# Patient Record
Sex: Female | Born: 1955 | Hispanic: No | Marital: Married | Smoking: Former smoker
Health system: Southern US, Community
[De-identification: ages and names within clinical notes are randomized; demographics above are authoritative.]

## PROBLEM LIST (undated history)

## (undated) DIAGNOSIS — J45909 Unspecified asthma, uncomplicated: Secondary | ICD-10-CM

## (undated) DIAGNOSIS — Z8489 Family history of other specified conditions: Secondary | ICD-10-CM

## (undated) DIAGNOSIS — T8859XA Other complications of anesthesia, initial encounter: Secondary | ICD-10-CM

## (undated) DIAGNOSIS — T7840XA Allergy, unspecified, initial encounter: Secondary | ICD-10-CM

## (undated) HISTORY — DX: Allergy, unspecified, initial encounter: T78.40XA

## (undated) HISTORY — PX: OTHER SURGICAL HISTORY: SHX169

## (undated) HISTORY — DX: Unspecified asthma, uncomplicated: J45.909

---

## 2010-03-19 HISTORY — PX: COLONOSCOPY: SHX174

## 2015-03-03 ENCOUNTER — Encounter: Payer: Self-pay | Admitting: Family Medicine

## 2015-03-03 ENCOUNTER — Ambulatory Visit (INDEPENDENT_AMBULATORY_CARE_PROVIDER_SITE_OTHER): Payer: BLUE CROSS/BLUE SHIELD | Admitting: Family Medicine

## 2015-03-03 VITALS — BP 110/70 | HR 74 | Ht 62.0 in | Wt 164.0 lb

## 2015-03-03 DIAGNOSIS — Z7189 Other specified counseling: Secondary | ICD-10-CM

## 2015-03-03 DIAGNOSIS — Z7689 Persons encountering health services in other specified circumstances: Secondary | ICD-10-CM | POA: Insufficient documentation

## 2015-03-03 NOTE — Progress Notes (Signed)
Name: Monica Mckenzie   MRN: [id number]    DOB: 1955/11/24   Date:03/03/2015       Progress Note  Subjective  Chief Complaint  Chief Complaint  Patient presents with  . Establish Care    Dr retired    HPI Comments: Patient to establish care for maintenance and primary care.   No problem-specific assessment & plan notes found for this encounter.   Past Medical History  Diagnosis Date  . Allergy   . Asthma     Past Surgical History  Procedure Laterality Date  . Colonoscopy  2012    cleared for 10 yrs- Dr Bluford Kaufmann    Family History  Problem Relation Age of Onset  . Cancer Mother   . Diabetes Father   . Hypertension Father     Social History   Social History  . Marital Status: Married    Spouse Name: N/A  . Number of Children: N/A  . Years of Education: N/A   Occupational History  . Not on file.   Social History Main Topics  . Smoking status: Former Games developer  . Smokeless tobacco: Not on file  . Alcohol Use: 0.0 oz/week    0 Standard drinks or equivalent per week  . Drug Use: No  . Sexual Activity: Yes   Other Topics Concern  . Not on file   Social History Narrative  . No narrative on file    No Known Allergies   Review of Systems  Constitutional: Negative for fever, chills, weight loss and malaise/fatigue.  HENT: Negative for ear discharge, ear pain and sore throat.   Eyes: Negative for blurred vision.  Respiratory: Negative for cough, sputum production, shortness of breath and wheezing.   Cardiovascular: Negative for chest pain, palpitations and leg swelling.  Gastrointestinal: Negative for heartburn, nausea, abdominal pain, diarrhea, constipation, blood in stool and melena.  Genitourinary: Negative for dysuria, urgency, frequency and hematuria.  Musculoskeletal: Negative for myalgias, back pain, joint pain and neck pain.  Skin: Negative for rash.  Neurological: Negative for dizziness, tingling, sensory change, focal weakness and headaches.   Endo/Heme/Allergies: Negative for environmental allergies and polydipsia. Does not bruise/bleed easily.  Psychiatric/Behavioral: Negative for depression and suicidal ideas. The patient is not nervous/anxious and does not have insomnia.      Objective  Filed Vitals:   03/03/15 1426  BP: 110/70  Pulse: 74  Height:  (1.575 m)  Weight: 164 lb (74.39 kg)    Physical Exam  Constitutional: She is well-developed, well-nourished, and in no distress. No distress.  HENT:  Head: Normocephalic and atraumatic.  Right Ear: External ear normal.  Left Ear: External ear normal.  Nose: Nose normal.  Mouth/Throat: Oropharynx is clear and moist.  Eyes: Conjunctivae and EOM are normal. Pupils are equal, round, and reactive to light. Right eye exhibits no discharge. Left eye exhibits no discharge.  Neck: Normal range of motion. Neck supple. No JVD present. No thyromegaly present.  Cardiovascular: Normal rate, regular rhythm, normal heart sounds and intact distal pulses.  Exam reveals no gallop and no friction rub.   No murmur heard. Pulmonary/Chest: Effort normal and breath sounds normal.  Abdominal: Soft. Bowel sounds are normal. She exhibits no mass. There is no tenderness. There is no guarding.  Musculoskeletal: Normal range of motion. She exhibits no edema.  Lymphadenopathy:    She has no cervical adenopathy.  Neurological: She is alert.  Skin: Skin is warm and dry. She is not diaphoretic.  Psychiatric: Mood and affect  normal.  Nursing note and vitals reviewed.     Assessment & Plan  Problem List Items Addressed This Visit      Other   Encounter to establish care with new doctor - Primary        Dr. Elizabeth Sauereanna [facility]yn [facility]  03/03/2015

## 2015-03-03 NOTE — Patient Instructions (Signed)
Immunization Schedule, Adult  Influenza vaccine.  All adults should be immunized every year.  All adults, including pregnant women and people with hives-only allergy to eggs can receive the inactivated influenza (IIV) vaccine.  Adults aged 60-49 years can receive the recombinant influenza (RIV) vaccine. The RIV vaccine does not contain any egg protein.  Adults aged 76 years or older can receive the standard-dose IIV or the high-dose IIV.  Tetanus, diphtheria, and acellular pertussis (Td, Tdap) vaccine.  Pregnant women should receive 1 dose of Tdap vaccine during each pregnancy. The dose should be obtained regardless of the length of time since the last dose. Immunization is preferred during the 27th to 36th week of gestation.  An adult who has not previously received Tdap or who does not know his or her vaccine status should receive 1 dose of Tdap. This initial dose should be followed by tetanus and diphtheria toxoids (Td) booster doses every 10 years.  Adults with an unknown or incomplete history of completing a 3-dose immunization series with Td-containing vaccines should begin or complete a primary immunization series including a Tdap dose.  Adults should receive a Td booster every 10 years.  Varicella vaccine.  An adult without evidence of immunity to varicella should receive 2 doses or a second dose if he or she has previously received 1 dose.  Pregnant females who do not have evidence of immunity should receive the first dose after pregnancy. This first dose should be obtained before leaving the health care facility. The second dose should be obtained 4-8 weeks after the first dose.  Human papillomavirus (HPV) vaccine.  Females aged 13-26 years who have not received the vaccine previously should obtain the 3-dose series.  The vaccine is not recommended for use in pregnant females. However, pregnancy testing is not needed before receiving a dose. If a female is found to be  pregnant after receiving a dose, no treatment is needed. In that case, the remaining doses should be delayed until after the pregnancy.  Males aged 37-21 years who have not received the vaccine previously should receive the 3-dose series. Males aged 22-26 years may be immunized.  Immunization is recommended through the age of 93 years for any female who has sex with males and did not get any or all doses earlier.  Immunization is recommended for any person with an immunocompromised condition through the age of 71 years if he or she did not get any or all doses earlier.  During the 3-dose series, the second dose should be obtained 4-8 weeks after the first dose. The third dose should be obtained 24 weeks after the first dose and 16 weeks after the second dose.  Zoster vaccine.  One dose is recommended for adults aged 73 years or older unless certain conditions are present.  Measles, mumps, and rubella (MMR) vaccine.  Adults born before [birth year] generally are considered immune to measles and mumps.  Adults born in [birth year] or later should have 1 or more doses of MMR vaccine unless there is a contraindication to the vaccine or there is laboratory evidence of immunity to each of the three diseases.  A routine second dose of MMR vaccine should be obtained at least 28 days after the first dose for students attending postsecondary schools, health care workers, or international travelers.  People who received inactivated measles vaccine or an unknown type of measles vaccine during 1963-1967 should receive 2 doses of MMR vaccine.  People who received inactivated mumps vaccine or an unknown type  of mumps vaccine before 1979 and are at high risk for mumps infection should consider immunization with 2 doses of MMR vaccine.  For females of childbearing age, rubella immunity should be determined. If there is no evidence of immunity, females who are not pregnant should be vaccinated. If there is no evidence of  immunity, females who are pregnant should delay immunization until after pregnancy.  Unvaccinated health care workers born before 1957 who lack laboratory evidence of measles, mumps, or rubella immunity or laboratory confirmation of disease should consider measles and mumps immunization with 2 doses of MMR vaccine or rubella immunization with 1 dose of MMR vaccine.  Pneumococcal 13-valent conjugate (PCV13) vaccine.  When indicated, a person who is uncertain of his or her immunization history and has no record of immunization should receive the PCV13 vaccine.  An adult aged 19 years or older who has certain medical conditions and has not been previously immunized should receive 1 dose of PCV13 vaccine. This PCV13 should be followed with a dose of pneumococcal polysaccharide (PPSV23) vaccine. The PPSV23 vaccine dose should be obtained at least 8 weeks after the dose of PCV13 vaccine.  An adult aged 19 years or older who has certain medical conditions and previously received 1 or more doses of PPSV23 vaccine should receive 1 dose of PCV13. The PCV13 vaccine dose should be obtained 1 or more years after the last PPSV23 vaccine dose.  Pneumococcal polysaccharide (PPSV23) vaccine.  When PCV13 is also indicated, PCV13 should be obtained first.  All adults aged 65 years and older should be immunized.  An adult younger than age 65 years who has certain medical conditions should be immunized.  Any person who resides in a nursing home or long-term care facility should be immunized.  An adult smoker should be immunized.  People with an immunocompromised condition and certain other conditions should receive both PCV13 and PPSV23 vaccines.  People with human immunodeficiency virus (HIV) infection should be immunized as soon as possible after diagnosis.  Immunization during chemotherapy or radiation therapy should be avoided.  Routine use of PPSV23 vaccine is not recommended for American Indians,  [place]atives, or people younger than 65 years unless there are medical conditions that require PPSV23 vaccine.  When indicated, people who have unknown immunization and have no record of immunization should receive PPSV23 vaccine.  One-time revaccination 5 years after the first dose of PPSV23 is recommended for people aged 19-64 years who have chronic kidney failure, nephrotic syndrome, asplenia, or immunocompromised conditions.  People who received 1-2 doses of PPSV23 before age 65 years should receive another dose of PPSV23 vaccine at age 65 years or later if at least 5 years have passed since the previous dose.  Doses of PPSV23 are not needed for people immunized with PPSV23 at or after age 65 years.  Meningococcal vaccine.  Adults with asplenia or persistent complement component deficiencies should receive 2 doses of quadrivalent meningococcal conjugate (MenACWY-D) vaccine. The doses should be obtained at least 2 months apart.  Microbiologists working with certain meningococcal bacteria, military recruits, people at risk during an outbreak, and people who travel to or live in countries with a high rate of meningitis should be immunized.  A first-year college student up through age 21 years who is living in a residence hall should receive a dose if he or she did not receive a dose on or after his or her 16th birthday.  Adults who have certain high-risk conditions should receive one or more doses   of vaccine.  Hepatitis A vaccine.  Adults who wish to be protected from this disease, have certain high-risk conditions, work with hepatitis A-infected animals, work in hepatitis A research labs, or travel to or work in countries with a high rate of hepatitis A should be immunized.  Adults who were previously unvaccinated and who anticipate close contact with an international adoptee during the first 60 days after arrival in the [place] from a country with a high rate of hepatitis A should  be immunized.  Hepatitis B vaccine.  Adults who wish to be protected from this disease, have certain high-risk conditions, may be exposed to blood or other infectious body fluids, are household contacts or sex partners of hepatitis B positive people, are clients or workers in certain care facilities, or travel to or work in countries with a high rate of hepatitis B should be immunized.  Haemophilus influenzae type b (Hib) vaccine.  A previously unvaccinated person with asplenia or sickle cell disease or having a scheduled splenectomy should receive 1 dose of Hib vaccine.  Regardless of previous immunization, a recipient of a hematopoietic stem cell transplant should receive a 3-dose series 6-12 months after his or her successful transplant.  Hib vaccine is not recommended for adults with HIV infection.   This information is not intended to replace advice given to you by your health care provider. Make sure you discuss any questions you have with your health care provider.   Document Released: 05/26/2003 Document Revised: 06/30/2012 Document Reviewed: 04/22/2012 Elsevier Interactive Patient Education 2016 [place] Maintenance, Female Adopting a healthy lifestyle and getting preventive care can go a long way to promote health and wellness. Talk with your health care provider about what schedule of regular examinations is right for you. This is a good chance for you to check in with your provider about disease prevention and staying healthy. In between checkups, there are plenty of things you can do on your own. Experts have done a lot of research about which lifestyle changes and preventive measures are most likely to keep you healthy. Ask your health care provider for more information. WEIGHT AND DIET  Eat a healthy diet  Be sure to include plenty of vegetables, fruits, low-fat dairy products, and lean protein.  Do not eat a lot of foods high in solid fats, added sugars, or  salt.  Get regular exercise. This is one of the most important things you can do for your health.  Most adults should exercise for at least 150 minutes each week. The exercise should increase your heart rate and make you sweat (moderate-intensity exercise).  Most adults should also do strengthening exercises at least twice a week. This is in addition to the moderate-intensity exercise.  Maintain a healthy weight  Body mass index (BMI) is a measurement that can be used to identify possible weight problems. It estimates body fat based on height and weight. Your health care provider can help determine your BMI and help you achieve or maintain a healthy weight.  For females 52 years of age and older:   A BMI below 18.5 is considered underweight.  A BMI of 18.5 to 24.9 is normal.  A BMI of 25 to 29.9 is considered overweight.  A BMI of 30 and above is considered obese.  Watch levels of cholesterol and blood lipids  You should start having your blood tested for lipids and cholesterol at 60 years of age, then have this test every 5 years.  You may need to have your cholesterol levels checked more often if:  Your lipid or cholesterol levels are high.  You are older than 60 years of age.  You are at high risk for heart disease.  CANCER SCREENING   Lung Cancer  Lung cancer screening is recommended for adults 34-71 years old who are at high risk for lung cancer because of a history of smoking.  A yearly low-dose CT scan of the lungs is recommended for people who:  Currently smoke.  Have quit within the past 15 years.  Have at least a 30-pack-year history of smoking. A pack year is smoking an average of one pack of cigarettes a day for 1 year.  Yearly screening should continue until it has been 15 years since you quit.  Yearly screening should stop if you develop a health problem that would prevent you from having lung cancer treatment.  Breast Cancer  Practice breast  self-awareness. This means understanding how your breasts normally appear and feel.  It also means doing regular breast self-exams. Let your health care provider know about any changes, no matter how small.  If you are in your 20s or 30s, you should have a clinical breast exam (CBE) by a health care provider every 1-3 years as part of a regular health exam.  If you are 14 or older, have a CBE every year. Also consider having a breast X-ray (mammogram) every year.  If you have a family history of breast cancer, talk to your health care provider about genetic screening.  If you are at high risk for breast cancer, talk to your health care provider about having an MRI and a mammogram every year.  Breast cancer gene (BRCA) assessment is recommended for women who have family members with BRCA-related cancers. BRCA-related cancers include:  Breast.  Ovarian.  Tubal.  Peritoneal cancers.  Results of the assessment will determine the need for genetic counseling and BRCA1 and BRCA2 testing. Cervical Cancer Your health care provider may recommend that you be screened regularly for cancer of the pelvic organs (ovaries, uterus, and vagina). This screening involves a pelvic examination, including checking for microscopic changes to the surface of your cervix (Pap test). You may be encouraged to have this screening done every 3 years, beginning at age 50.  For women ages 58-65, health care providers may recommend pelvic exams and Pap testing every 3 years, or they may recommend the Pap and pelvic exam, combined with testing for human papilloma virus (HPV), every 5 years. Some types of HPV increase your risk of cervical cancer. Testing for HPV may also be done on women of any age with unclear Pap test results.  Other health care providers may not recommend any screening for nonpregnant women who are considered low risk for pelvic cancer and who do not have symptoms. Ask your health care provider if a  screening pelvic exam is right for you.  If you have had past treatment for cervical cancer or a condition that could lead to cancer, you need Pap tests and screening for cancer for at least 20 years after your treatment. If Pap tests have been discontinued, your risk factors (such as having a new sexual partner) need to be reassessed to determine if screening should resume. Some women have medical problems that increase the chance of getting cervical cancer. In these cases, your health care provider may recommend more frequent screening and Pap tests. Colorectal Cancer  This type of cancer can be detected  and often prevented.  Routine colorectal cancer screening usually begins at 60 years of age and continues through 60 years of age.  Your health care provider may recommend screening at an earlier age if you have risk factors for colon cancer.  Your health care provider may also recommend using home test kits to check for hidden blood in the stool.  A small camera at the end of a tube can be used to examine your colon directly (sigmoidoscopy or colonoscopy). This is done to check for the earliest forms of colorectal cancer.  Routine screening usually begins at age 15.  Direct examination of the colon should be repeated every 5-10 years through 60 years of age. However, you may need to be screened more often if early forms of precancerous polyps or small growths are found. Skin Cancer  Check your skin from head to toe regularly.  Tell your health care provider about any new moles or changes in moles, especially if there is a change in a mole's shape or color.  Also tell your health care provider if you have a mole that is larger than the size of a pencil eraser.  Always use sunscreen. Apply sunscreen liberally and repeatedly throughout the day.  Protect yourself by wearing long sleeves, pants, a wide-brimmed hat, and sunglasses whenever you are outside. HEART DISEASE, DIABETES, AND HIGH  BLOOD PRESSURE   High blood pressure causes heart disease and increases the risk of stroke. High blood pressure is more likely to develop in:  People who have blood pressure in the high end of the normal range (130-139/85-89 mm Hg).  People who are overweight or obese.  People who are African American.  If you are 46-91 years of age, have your blood pressure checked every 3-5 years. If you are 17 years of age or older, have your blood pressure checked every year. You should have your blood pressure measured twice--once when you are at a hospital or clinic, and once when you are not at a hospital or clinic. Record the average of the two measurements. To check your blood pressure when you are not at a hospital or clinic, you can use:  An automated blood pressure machine at a pharmacy.  A home blood pressure monitor.  If you are between 1 years and 65 years old, ask your health care provider if you should take aspirin to prevent strokes.  Have regular diabetes screenings. This involves taking a blood sample to check your fasting blood sugar level.  If you are at a normal weight and have a low risk for diabetes, have this test once every three years after 60 years of age.  If you are overweight and have a high risk for diabetes, consider being tested at a younger age or more often. PREVENTING INFECTION  Hepatitis B  If you have a higher risk for hepatitis B, you should be screened for this virus. You are considered at high risk for hepatitis B if:  You were born in a country where hepatitis B is common. Ask your health care provider which countries are considered high risk.  Your parents were born in a high-risk country, and you have not been immunized against hepatitis B (hepatitis B vaccine).  You have HIV or AIDS.  You use needles to inject street drugs.  You live with someone who has hepatitis B.  You have had sex with someone who has hepatitis B.  You get hemodialysis  treatment.  You take certain medicines for  conditions, including cancer, organ transplantation, and autoimmune conditions. Hepatitis C  Blood testing is recommended for:  Everyone born from [birth year] through 1966.  Anyone with known risk factors for hepatitis C. Sexually transmitted infections (STIs)  You should be screened for sexually transmitted infections (STIs) including gonorrhea and chlamydia if:  You are sexually active and are younger than 60 years of age.  You are older than 60 years of age and your health care provider tells you that you are at risk for this type of infection.  Your sexual activity has changed since you were last screened and you are at an increased risk for chlamydia or gonorrhea. Ask your health care provider if you are at risk.  If you do not have HIV, but are at risk, it may be recommended that you take a prescription medicine daily to prevent HIV infection. This is called pre-exposure prophylaxis (PrEP). You are considered at risk if:  You are sexually active and do not regularly use condoms or know the HIV status of your partner(s).  You take drugs by injection.  You are sexually active with a partner who has HIV. Talk with your health care provider about whether you are at high risk of being infected with HIV. If you choose to begin PrEP, you should first be tested for HIV. You should then be tested every 3 months for as long as you are taking PrEP.  PREGNANCY   If you are premenopausal and you may become pregnant, ask your health care provider about preconception counseling.  If you may become pregnant, take 400 to 800 micrograms (mcg) of folic acid every day.  If you want to prevent pregnancy, talk to your health care provider about birth control (contraception). OSTEOPOROSIS AND MENOPAUSE   Osteoporosis is a disease in which the bones lose minerals and strength with aging. This can result in serious bone fractures. Your risk for osteoporosis can  be identified using a bone density scan.  If you are 36 years of age or older, or if you are at risk for osteoporosis and fractures, ask your health care provider if you should be screened.  Ask your health care provider whether you should take a calcium or vitamin D supplement to lower your risk for osteoporosis.  Menopause may have certain physical symptoms and risks.  Hormone replacement therapy may reduce some of these symptoms and risks. Talk to your health care provider about whether hormone replacement therapy is right for you.  HOME CARE INSTRUCTIONS   Schedule regular health, dental, and eye exams.  Stay current with your immunizations.   Do not use any tobacco products including cigarettes, chewing tobacco, or electronic cigarettes.  If you are pregnant, do not drink alcohol.  If you are breastfeeding, limit how much and how often you drink alcohol.  Limit alcohol intake to no more than 1 drink per day for nonpregnant women. One drink equals 12 ounces of beer, 5 ounces of wine, or 1 ounces of hard liquor.  Do not use street drugs.  Do not share needles.  Ask your health care provider for help if you need support or information about quitting drugs.  Tell your health care provider if you often feel depressed.  Tell your health care provider if you have ever been abused or do not feel safe at home.   This information is not intended to replace advice given to you by your health care provider. Make sure you discuss any questions you have with your  health care provider.   Document Released: 09/18/2010 Document Revised: 03/26/2014 Document Reviewed: 02/04/2013 Elsevier Interactive Patient Education [employer].

## 2015-04-07 ENCOUNTER — Ambulatory Visit (INDEPENDENT_AMBULATORY_CARE_PROVIDER_SITE_OTHER): Payer: BLUE CROSS/BLUE SHIELD | Admitting: Family Medicine

## 2015-04-07 ENCOUNTER — Encounter: Payer: Self-pay | Admitting: Family Medicine

## 2015-04-07 VITALS — BP 140/90 | HR 72 | Ht 62.0 in | Wt 166.0 lb

## 2015-04-07 DIAGNOSIS — Z1239 Encounter for other screening for malignant neoplasm of breast: Secondary | ICD-10-CM

## 2015-04-07 DIAGNOSIS — Z23 Encounter for immunization: Secondary | ICD-10-CM

## 2015-04-07 DIAGNOSIS — Z0000 Encounter for general adult medical examination without abnormal findings: Secondary | ICD-10-CM

## 2015-04-07 DIAGNOSIS — L821 Other seborrheic keratosis: Secondary | ICD-10-CM | POA: Diagnosis not present

## 2015-04-07 DIAGNOSIS — Z1211 Encounter for screening for malignant neoplasm of colon: Secondary | ICD-10-CM

## 2015-04-07 LAB — HEMOCCULT GUIAC POC 1CARD (OFFICE): FECAL OCCULT BLD: NEGATIVE

## 2015-04-07 LAB — POCT URINALYSIS DIPSTICK
BILIRUBIN UA: NEGATIVE
Blood, UA: NEGATIVE
GLUCOSE UA: NEGATIVE
KETONES UA: NEGATIVE
LEUKOCYTES UA: NEGATIVE
Nitrite, UA: NEGATIVE
Protein, UA: NEGATIVE
SPEC GRAV UA: 1.02
Urobilinogen, UA: 0.2
pH, UA: 5

## 2015-04-07 NOTE — Progress Notes (Signed)
Name: Monica Mckenzie   MRN: [id number]    DOB: Oct 05, 1955   Date:04/07/2015       Progress Note  Subjective  Chief Complaint  Chief Complaint  Patient presents with  . Annual Exam    with pap and breast exam  . tdap    needs vacc    HPI Comments: Patient presents for annual physical   No problem-specific assessment & plan notes found for this encounter.   Past Medical History  Diagnosis Date  . Allergy   . Asthma     Past Surgical History  Procedure Laterality Date  . Colonoscopy  2012    cleared for 10 yrs- Dr Bluford Kaufmann    Family History  Problem Relation Age of Onset  . Cancer Mother   . Diabetes Father   . Hypertension Father     Social History   Social History  . Marital Status: Married    Spouse Name: N/A  . Number of Children: N/A  . Years of Education: N/A   Occupational History  . Not on file.   Social History Main Topics  . Smoking status: Former Games developer  . Smokeless tobacco: Not on file  . Alcohol Use: 0.0 oz/week    0 Standard drinks or equivalent per week  . Drug Use: No  . Sexual Activity: Yes   Other Topics Concern  . Not on file   Social History Narrative    No Known Allergies   Review of Systems  Constitutional: Negative for fever, chills, weight loss and malaise/fatigue.  HENT: Negative for ear discharge, ear pain and sore throat.   Eyes: Negative for blurred vision.  Respiratory: Negative for cough, sputum production, shortness of breath and wheezing.   Cardiovascular: Negative for chest pain, palpitations and leg swelling.  Gastrointestinal: Negative for heartburn, nausea, abdominal pain, diarrhea, constipation, blood in stool and melena.  Genitourinary: Negative for dysuria, urgency, frequency and hematuria.  Musculoskeletal: Negative for myalgias, back pain, joint pain and neck pain.  Skin: Negative for itching and rash.  Neurological: Negative for dizziness, tingling, sensory change, focal weakness and headaches.   Endo/Heme/Allergies: Negative for environmental allergies and polydipsia. Does not bruise/bleed easily.  Psychiatric/Behavioral: Negative for depression and suicidal ideas. The patient is not nervous/anxious and does not have insomnia.      Objective  Filed Vitals:   04/07/15 0841  BP: 140/90  Pulse: 72  Height:  (1.575 m)  Weight: 166 lb (75.297 kg)    Physical Exam  Constitutional: She is well-developed, well-nourished, and in no distress. No distress.  HENT:  Head: Normocephalic and atraumatic.  Right Ear: External ear normal.  Left Ear: External ear normal.  Nose: Nose normal.  Mouth/Throat: Oropharynx is clear and moist.  Eyes: Conjunctivae and EOM are normal. Pupils are equal, round, and reactive to light. Right eye exhibits no discharge. Left eye exhibits no discharge.  Neck: Normal range of motion. Neck supple. No JVD present. No thyromegaly present.  Cardiovascular: Normal rate, regular rhythm, normal heart sounds and intact distal pulses.  Exam reveals no gallop and no friction rub.   No murmur heard. Pulmonary/Chest: Effort normal and breath sounds normal. Right breast exhibits no inverted nipple, no mass, no nipple discharge, no skin change and no tenderness. Left breast exhibits no inverted nipple, no mass, no nipple discharge, no skin change and no tenderness. Breasts are symmetrical.  Abdominal: Soft. Bowel sounds are normal. She exhibits no mass. There is no tenderness. There is no guarding.  Genitourinary: Rectum normal, vagina normal, uterus normal, cervix normal, right adnexa normal, left adnexa normal and vulva normal.  Musculoskeletal: Normal range of motion. She exhibits no edema.  Lymphadenopathy:    She has no cervical adenopathy.  Neurological: She is alert. She has normal reflexes.  Skin: Skin is warm and dry. She is not diaphoretic.  Psychiatric: Mood and affect normal.  Nursing note and vitals reviewed.     Assessment & Plan  Problem List  Items Addressed This Visit    None    Visit Diagnoses    Annual physical exam    -  Primary    Relevant Orders    Lipid Profile    Renal Function Panel    POCT Occult Blood Stool (Completed)    POCT Urinalysis Dipstick (Completed)    MM Digital Screening    Pap IG (Image Guided)    Seborrheic keratosis        Relevant Orders    Ambulatory referral to Dermatology    Breast cancer screening        Relevant Orders    MM Digital Screening    Colon cancer screening        Relevant Orders    POCT Occult Blood Stool (Completed)    Need for Tdap vaccination        Relevant Orders    Tdap vaccine greater than or equal to 7yo IM (Completed)         Dr. Hayden [facility]  04/07/2015

## 2015-04-08 LAB — LIPID PANEL
CHOL/HDL RATIO: 3.5 ratio (ref 0.0–4.4)
CHOLESTEROL TOTAL: 184 mg/dL (ref 100–199)
HDL: 53 mg/dL (ref >39)
LDL Calculated: 115 mg/dL — ABNORMAL HIGH (ref 0–99)
TRIGLYCERIDES: 79 mg/dL (ref 0–149)
VLDL Cholesterol Cal: 16 mg/dL (ref 5–40)

## 2015-04-08 LAB — RENAL FUNCTION PANEL
Albumin: 4.3 g/dL (ref 3.6–4.8)
BUN/Creatinine Ratio: 20 (ref 11–26)
BUN: 14 mg/dL (ref 8–27)
CALCIUM: 9.7 mg/dL (ref 8.7–10.3)
CO2: 24 mmol/L (ref 18–29)
CREATININE: 0.71 mg/dL (ref 0.57–1.00)
Chloride: 103 mmol/L (ref 96–106)
GFR calc Af Amer: 107 mL/min/{1.73_m2} (ref >59)
GFR, EST NON AFRICAN AMERICAN: 93 mL/min/{1.73_m2} (ref >59)
Glucose: 93 mg/dL (ref 65–99)
PHOSPHORUS: 4.3 mg/dL (ref 2.5–4.5)
Potassium: 4.6 mmol/L (ref 3.5–5.2)
SODIUM: 142 mmol/L (ref 134–144)

## 2015-04-11 LAB — PAP IG (IMAGE GUIDED): PAP Smear Comment: 0

## 2015-04-13 ENCOUNTER — Inpatient Hospital Stay
Admission: RE | Admit: 2015-04-13 | Discharge: 2015-04-13 | Disposition: A | Discharge status: Home / Self Care | Payer: Self-pay | Source: Ambulatory Visit | Attending: *Deleted | Admitting: *Deleted

## 2015-04-13 ENCOUNTER — Other Ambulatory Visit: Payer: Self-pay | Admitting: *Deleted

## 2015-04-13 DIAGNOSIS — Z9289 Personal history of other medical treatment: Secondary | ICD-10-CM

## 2015-04-15 ENCOUNTER — Ambulatory Visit
Admission: RE | Admit: 2015-04-15 | Discharge: 2015-04-15 | Disposition: A | Discharge status: Home / Self Care | Payer: BLUE CROSS/BLUE SHIELD | Source: Ambulatory Visit | Attending: Family Medicine | Admitting: Family Medicine

## 2015-04-15 DIAGNOSIS — Z1231 Encounter for screening mammogram for malignant neoplasm of breast: Secondary | ICD-10-CM | POA: Diagnosis present

## 2015-04-15 DIAGNOSIS — Z0000 Encounter for general adult medical examination without abnormal findings: Secondary | ICD-10-CM

## 2015-04-15 DIAGNOSIS — Z1239 Encounter for other screening for malignant neoplasm of breast: Secondary | ICD-10-CM

## 2015-12-04 ENCOUNTER — Ambulatory Visit
Admission: EM | Admit: 2015-12-04 | Discharge: 2015-12-04 | Disposition: A | Discharge status: Home / Self Care | Payer: No Typology Code available for payment source | Attending: Family Medicine | Admitting: Family Medicine

## 2015-12-04 ENCOUNTER — Encounter: Payer: Self-pay | Admitting: Gynecology

## 2015-12-04 DIAGNOSIS — J029 Acute pharyngitis, unspecified: Secondary | ICD-10-CM | POA: Diagnosis not present

## 2015-12-04 DIAGNOSIS — B9789 Other viral agents as the cause of diseases classified elsewhere: Secondary | ICD-10-CM

## 2015-12-04 DIAGNOSIS — J069 Acute upper respiratory infection, unspecified: Secondary | ICD-10-CM | POA: Diagnosis not present

## 2015-12-04 LAB — RAPID STREP SCREEN (MED CTR MEBANE ONLY): STREPTOCOCCUS, GROUP A SCREEN (DIRECT): NEGATIVE

## 2015-12-04 MED ORDER — PREDNISONE 10 MG (21) PO TBPK: 10.0000 mg | ORAL_TABLET | Freq: Every day | ORAL | 0 refills | Status: DC

## 2015-12-04 MED ORDER — AMOXICILLIN-POT CLAVULANATE 250-62.5 MG/5ML PO SUSR: 250.0000 mg | Freq: Three times a day (TID) | ORAL | 0 refills | Status: AC

## 2015-12-04 NOTE — ED Triage Notes (Signed)
Patient c/o sore throat/ sinus/ cough x 4 days.

## 2015-12-04 NOTE — ED Provider Notes (Signed)
CSN: [id number]     Arrival date & time 12/04/15  0955 History   First MD Initiated Contact with Patient 12/04/15 1033     Chief Complaint  Patient presents with  . Otalgia  . Sore Throat  . Cough   (Consider location/radiation/quality/duration/timing/severity/associated sxs/prior Treatment) Pt has chronic seasonal allergies. For the past 30 yrs she has received allergy shots last one on 12/01/2015. Since 9/214/2017 she has has runny nose, upper chest congestion, headache intermit, facial pain. States that these are the same sx she gets yearly with allergies. Denies any chest pain, no sob, no blurred vision. States that she usually takes a steroid pack and Augmentin and does fine. Has been taking motrin and a cold and sinus medication at home with minimal relief.       Past Medical History:  Diagnosis Date  . Allergy   . Asthma    Past Surgical History:  Procedure Laterality Date  . COLONOSCOPY  2012   cleared for 10 yrs- Dr Bluford Kaufmann   Family History  Problem Relation Age of Onset  . Cancer Mother   . Breast cancer Mother 6  . Diabetes Father   . Hypertension Father    Social History  Substance Use Topics  . Smoking status: Former Games developer  . Smokeless tobacco: Never Used  . Alcohol use 0.0 oz/week   OB History    No data available     Review of Systems  Constitutional: Negative.   HENT: Positive for congestion, postnasal drip, rhinorrhea, sinus pressure, sneezing and sore throat.   Eyes: Negative.   Respiratory: Positive for cough.   Cardiovascular: Negative.   Gastrointestinal: Negative.   Endocrine: Negative.   Musculoskeletal: Negative.   Neurological: Negative.     Allergies  Review of patient's allergies indicates no known allergies.  Home Medications   Prior to Admission medications   Medication Sig Start Date End Date Taking? Authorizing Provider  Calcium Carb-Cholecalciferol (CALTRATE 600+D3 SOFT PO) Take 1 tablet by mouth daily.   Yes Historical  Provider, MD  fluticasone (FLONASE) 50 MCG/ACT nasal spray Place into both nostrils daily.   Yes Historical Provider, MD  Multiple Vitamins-Minerals (CENTRUM SILVER ULTRA WOMENS PO) Take 1 tablet by mouth daily.   Yes Historical Provider, MD  Phenazopyridine HCl (AZO TABS PO) Take 2 capsules by mouth daily.   Yes Historical Provider, MD  amoxicillin-clavulanate (AUGMENTIN) 250-62.5 MG/5ML suspension Take 5 mLs (250 mg total) by mouth 3 (three) times daily. 12/04/15 12/11/15  Tobi Bastos, NP  loratadine-pseudoephedrine (CLARITIN-D 24-HOUR) 10-240 MG 24 hr tablet Take 1 tablet by mouth as needed for allergies.    Historical Provider, MD  montelukast (SINGULAIR) 10 MG tablet Take 1 tablet by mouth daily. 02/26/15   Historical Provider, MD  predniSONE (STERAPRED UNI-PAK 21 TAB) 10 MG (21) TBPK tablet Take 1 tablet (10 mg total) by mouth daily. Take 6 tabs by mouth daily  for 2 days, then 5 tabs for 2 days, then 4 tabs for 2 days, then 3 tabs for 2 days, 2 tabs for 2 days, then 1 tab by mouth daily for 2 days 12/04/15   Tobi Bastos, NP   Meds Ordered and Administered this Visit  Medications - No data to display  BP 123/72 (BP Location: Left Arm)   Pulse 92   Temp 99.1 F (37.3 C) (Oral)   Resp 16   Ht 5\' 2"  (1.575 m)   Wt 165 lb (74.8 kg)   SpO2 100%  BMI 30.18 kg/m  No data found.   Physical Exam  Constitutional: She is oriented to person, place, and time. She appears well-developed and well-nourished.  HENT:  Right Ear: External ear normal.  Left Ear: External ear normal.  Clear nasal drainage, erythema to oral pharax.   Eyes: EOM are normal. Pupils are equal, round, and reactive to light.  Neck: Normal range of motion.  Cardiovascular: Normal rate and regular rhythm.   Pulmonary/Chest: Effort normal and breath sounds normal.  Non productive cough   Abdominal: Soft.  Musculoskeletal: Normal range of motion.  Neurological: She is alert and oriented to person, place, and  time.  Skin: Skin is warm and dry.    Urgent Care Course   Clinical Course    Procedures (including critical care time)  Labs Review Labs Reviewed  RAPID STREP SCREEN (NOT AT [facility])  CULTURE, GROUP A STREP [facility]    Imaging Review No results found.   Visual Acuity Review  Right Eye Distance:   Left Eye Distance:   Bilateral Distance:    Right Eye Near:   Left Eye Near:    Bilateral Near:         MDM   1. Pharyngitis   2. Viral URI with cough    Continue to use Flonase nasal spray Take full dose of ABX  If sx get worse may need a chest x ray  May use a humidifier to help      Tobi BastosMelanie A Lavontay Kirk, NP 12/04/15 1049

## 2015-12-04 NOTE — Discharge Instructions (Signed)
If sx worsen may need to have a chest x ray

## 2015-12-07 LAB — CULTURE, GROUP A STREP (THRC)

## 2015-12-22 ENCOUNTER — Other Ambulatory Visit: Payer: Self-pay

## 2015-12-22 ENCOUNTER — Encounter: Payer: Self-pay | Admitting: Family Medicine

## 2015-12-22 ENCOUNTER — Ambulatory Visit (INDEPENDENT_AMBULATORY_CARE_PROVIDER_SITE_OTHER): Payer: No Typology Code available for payment source | Admitting: Family Medicine

## 2015-12-22 VITALS — BP 114/64 | HR 99 | Temp 98.8°F | Ht 62.0 in | Wt 186.0 lb

## 2015-12-22 DIAGNOSIS — J029 Acute pharyngitis, unspecified: Secondary | ICD-10-CM

## 2015-12-22 MED ORDER — VALACYCLOVIR HCL 1 G PO TABS: 1000.0000 mg | ORAL_TABLET | Freq: Two times a day (BID) | ORAL | 0 refills | Status: DC

## 2015-12-22 NOTE — Progress Notes (Signed)
Name: Ulyses JarredCarol Roddey   MRN: [id number]    DOB: 12-24-1955   Date:12/22/2015       Progress Note  Subjective  Chief Complaint  Chief Complaint  Patient presents with  . Follow-up    was seen in urgent care. has gotten a little better but L) side of throat is sore and radiates up to L) ear. Cough and hoarseness. Finished pred pack and Augmentin.    Sore Throat   This is a new problem. The current episode started 1 to 4 weeks ago. The problem has been waxing and waning. The pain is worse on the left side. There has been no fever. The pain is at a severity of 5/10. The pain is mild. Associated symptoms include congestion, ear pain, headaches, a hoarse voice, shortness of breath, swollen glands and trouble swallowing. Pertinent negatives include no abdominal pain, coughing, diarrhea, drooling, ear discharge or neck pain. She has tried nothing for the symptoms.    No problem-specific Assessment & Plan notes found for this encounter.   Past Medical History:  Diagnosis Date  . Allergy   . Asthma     Past Surgical History:  Procedure Laterality Date  . COLONOSCOPY  2012   cleared for 10 yrs- Dr Bluford Kaufmannh    Family History  Problem Relation Age of Onset  . Cancer Mother   . Breast cancer Mother [id number]  . Diabetes Father   . Hypertension Father     Social History   Social History  . Marital status: Married    Spouse name: N/A  . Number of children: N/A  . Years of education: N/A   Occupational History  . Not on file.   Social History Main Topics  . Smoking status: Former Games developermoker  . Smokeless tobacco: Never Used  . Alcohol use 0.0 oz/week  . Drug use: No  . Sexual activity: Yes   Other Topics Concern  . Not on file   Social History Narrative  . No narrative on file    No Known Allergies   Review of Systems  Constitutional: Negative for chills, fever, malaise/fatigue and weight loss.  HENT: Positive for congestion, ear pain, hoarse voice and trouble swallowing. Negative for  drooling, ear discharge and sore throat.   Eyes: Negative for blurred vision and redness.  Respiratory: Positive for shortness of breath. Negative for cough, sputum production and wheezing.   Cardiovascular: Negative for chest pain, palpitations and leg swelling.  Gastrointestinal: Negative for abdominal pain, blood in stool, constipation, diarrhea, heartburn, melena and nausea.  Genitourinary: Negative for dysuria, frequency, hematuria and urgency.  Musculoskeletal: Negative for back pain, joint pain, myalgias and neck pain.  Skin: Negative for rash.  Neurological: Positive for headaches. Negative for dizziness, tingling, sensory change and focal weakness.  Endo/Heme/Allergies: Negative for environmental allergies and polydipsia. Does not bruise/bleed easily.  Psychiatric/Behavioral: Negative for depression and suicidal ideas. The patient is not nervous/anxious and does not have insomnia.      Objective  Vitals:   12/22/15 1345  BP: 114/64  Pulse: 99  Temp: 98.8 F (37.1 C)  TempSrc: Oral  Weight: 186 lb (84.4 kg)  Height: 5\' 2"  (1.575 m)    Physical Exam  Constitutional: She is well-developed, well-nourished, and in no distress. No distress.  HENT:  Head: Normocephalic and atraumatic.  Right Ear: Tympanic membrane, external ear and ear canal normal.  Left Ear: Tympanic membrane, external ear and ear canal normal.  Nose: Nose normal.  Mouth/Throat: Uvula is midline and  oropharynx is clear and moist. No oropharyngeal exudate, posterior oropharyngeal edema, posterior oropharyngeal erythema or tonsillar abscesses.    Ulceration left upper tonsil  Eyes: Conjunctivae and EOM are normal. Pupils are equal, round, and reactive to light. Right eye exhibits no discharge. Left eye exhibits no discharge.  Neck: Normal range of motion. Neck supple. No JVD present. No thyromegaly present.  Cardiovascular: Normal rate, regular rhythm, normal heart sounds and intact distal pulses.  Exam  reveals no gallop and no friction rub.   No murmur heard. Pulmonary/Chest: Effort normal and breath sounds normal. She has no wheezes. She has no rales.  Abdominal: Soft. Bowel sounds are normal. She exhibits no mass. There is no hepatosplenomegaly. There is no tenderness. There is no guarding and no CVA tenderness.  Musculoskeletal: Normal range of motion. She exhibits no edema.  Lymphadenopathy:    She has no cervical adenopathy.  Neurological: She is alert.  Skin: Skin is warm and dry. She is not diaphoretic.  Psychiatric: Mood and affect normal.  Nursing note and vitals reviewed.     Assessment & Plan  Problem List Items Addressed This Visit    None    Visit Diagnoses    Viral pharyngitis    -  Primary   [facility] magic mw   Relevant Medications   valACYclovir (VALTREX) 1000 MG tablet    called in [facility] Mouthwash 1 tsp daily x 7 days.   I spent 15 minutes with this patient, More than 50% of that time was spent in face to face education, counseling and care coordination.   Dr. [facility]  12/22/15

## 2017-12-16 ENCOUNTER — Emergency Department
Admission: EM | Admit: 2017-12-16 | Discharge: 2017-12-16 | Disposition: A | Discharge status: Home / Self Care | Payer: BLUE CROSS/BLUE SHIELD | Attending: Emergency Medicine | Admitting: Emergency Medicine

## 2017-12-16 ENCOUNTER — Emergency Department: Payer: BLUE CROSS/BLUE SHIELD

## 2017-12-16 ENCOUNTER — Other Ambulatory Visit: Payer: Self-pay

## 2017-12-16 DIAGNOSIS — Z87891 Personal history of nicotine dependence: Secondary | ICD-10-CM | POA: Insufficient documentation

## 2017-12-16 DIAGNOSIS — S62326A Displaced fracture of shaft of fifth metacarpal bone, right hand, initial encounter for closed fracture: Secondary | ICD-10-CM

## 2017-12-16 DIAGNOSIS — T1501XA Foreign body in cornea, right eye, initial encounter: Secondary | ICD-10-CM | POA: Insufficient documentation

## 2017-12-16 DIAGNOSIS — S52121A Displaced fracture of head of right radius, initial encounter for closed fracture: Secondary | ICD-10-CM | POA: Insufficient documentation

## 2017-12-16 DIAGNOSIS — Y999 Unspecified external cause status: Secondary | ICD-10-CM | POA: Insufficient documentation

## 2017-12-16 DIAGNOSIS — J45909 Unspecified asthma, uncomplicated: Secondary | ICD-10-CM | POA: Diagnosis not present

## 2017-12-16 DIAGNOSIS — Z79899 Other long term (current) drug therapy: Secondary | ICD-10-CM | POA: Diagnosis not present

## 2017-12-16 DIAGNOSIS — W010XXA Fall on same level from slipping, tripping and stumbling without subsequent striking against object, initial encounter: Secondary | ICD-10-CM | POA: Insufficient documentation

## 2017-12-16 DIAGNOSIS — Y92480 Sidewalk as the place of occurrence of the external cause: Secondary | ICD-10-CM | POA: Insufficient documentation

## 2017-12-16 DIAGNOSIS — Y9301 Activity, walking, marching and hiking: Secondary | ICD-10-CM | POA: Insufficient documentation

## 2017-12-16 DIAGNOSIS — S59911A Unspecified injury of right forearm, initial encounter: Secondary | ICD-10-CM | POA: Diagnosis present

## 2017-12-16 MED ORDER — TETRACAINE HCL 0.5 % OP SOLN
OPHTHALMIC | Status: AC
  Administered 2017-12-16: 2 [drp] via OPHTHALMIC
  Filled 2017-12-16: qty 4

## 2017-12-16 MED ORDER — OXYCODONE-ACETAMINOPHEN 5-325 MG PO TABS: 1.0000 | ORAL_TABLET | Freq: Four times a day (QID) | ORAL | 0 refills | Status: DC | PRN

## 2017-12-16 MED ORDER — TETRACAINE HCL 0.5 % OP SOLN
2.0000 [drp] | Freq: Once | OPHTHALMIC | Status: AC
  Administered 2017-12-16: 2 [drp] via OPHTHALMIC

## 2017-12-16 MED ORDER — ONDANSETRON 8 MG PO TBDP
8.0000 mg | ORAL_TABLET | Freq: Once | ORAL | Status: AC
  Administered 2017-12-16: 8 mg via ORAL
  Filled 2017-12-16: qty 1

## 2017-12-16 MED ORDER — OXYCODONE-ACETAMINOPHEN 5-325 MG PO TABS
1.0000 | ORAL_TABLET | Freq: Once | ORAL | Status: AC
  Administered 2017-12-16: 1 via ORAL
  Filled 2017-12-16: qty 1

## 2017-12-16 NOTE — ED Provider Notes (Signed)
[facility] Emergency Department Provider Note  ____________________________________________  Time seen: Approximately 8:21 PM  I have reviewed the triage vital signs and the nursing notes.   HISTORY  Chief Complaint Arm Injury    HPI Monica Mckenzie is a 62 y.o. female who presents the emergency department complaining of right elbow, wrist and hand pain.  Patient reports that she was at a town, was leaving a restaurant when she did not see a crack in the sidewalk.  Patient states that she believes she caught her foot, pinching her forward.  She is unsure how she landed on her right arm but states that initially she had visibly deformed fourth and fifth digits to the right hand.  She states she was able to straighten them, has been able to open close all digits right hand appropriately.  Patient reports that majority of pain, continued edema occurred to her right elbow.  She states that she is able to move her wrist and hand but is unable to extend, flex, supinate or pronate her elbow due to pain.  She did not hit her head or lose consciousness.  She denies any other injury or complaint.  Last meal was approximately 8.5 hours prior to arrival.  Patient has a history of seasonal allergies and asthma.  No other chronic medical problems.  No daily medications.    Past Medical History:  Diagnosis Date  . Allergy   . Asthma     Patient Active Problem List   Diagnosis Date Noted  . Encounter to establish care with new doctor 03/03/2015    Past Surgical History:  Procedure Laterality Date  . COLONOSCOPY  2012   cleared for 10 yrs- Dr Bluford Kaufmann    Prior to Admission medications   Medication Sig Start Date End Date Taking? Authorizing Provider  Calcium Carb-Cholecalciferol (CALTRATE 600+D3 SOFT PO) Take 1 tablet by mouth daily.    [provider]  loratadine-pseudoephedrine (CLARITIN-D 24-HOUR) 10-240 MG 24 hr tablet Take 1 tablet by mouth as needed for  allergies.    [provider]  Multiple Vitamins-Minerals (CENTRUM SILVER ULTRA WOMENS PO) Take 1 tablet by mouth daily.    [provider]  oxyCODONE-acetaminophen (PERCOCET/ROXICET) 5-325 MG tablet Take 1 tablet by mouth every 6 (six) hours as needed for severe pain. 12/16/17   Kelda Azad, Delorise Royals, PA-C  Phenazopyridine HCl (AZO TABS PO) Take 2 capsules by mouth daily.    [provider]  valACYclovir (VALTREX) 1000 MG tablet Take 1 tablet (1,000 mg total) by mouth 2 (two) times daily. 12/22/15   Duanne Limerick, MD    Allergies Patient has no known allergies.  Family History  Problem Relation Age of Onset  . Cancer Mother   . Breast cancer Mother 46  . Diabetes Father   . Hypertension Father     Social History Social History   Tobacco Use  . Smoking status: Former Games developer  . Smokeless tobacco: Never Used  Substance Use Topics  . Alcohol use: Yes    Alcohol/week: 0.0 standard drinks  . Drug use: No     Review of Systems  Constitutional: No fever/chills Eyes: No visual changes.  Cardiovascular: no chest pain. Respiratory: no cough. No SOB. Gastrointestinal: No abdominal pain.  No nausea, no vomiting. Musculoskeletal: Positive for right hand, right wrist, right elbow injury Skin: Negative for rash, abrasions, lacerations, ecchymosis. Neurological: Negative for headaches, focal weakness or numbness. 10-point ROS otherwise negative.  ____________________________________________   PHYSICAL EXAM:  VITAL  SIGNS: ED Triage Vitals  Enc Vitals Group     BP 12/16/17 1951 (!) 149/73     Pulse Rate 12/16/17 1951 (!) 104     Resp 12/16/17 1951 16     Temp 12/16/17 1951 98.6 F (37 C)     Temp Source 12/16/17 1951 Oral     SpO2 12/16/17 1951 98 %     Weight 12/16/17 1951 180 lb (81.6 kg)     Height 12/16/17 1951 5\' 1"  (1.549 m)     Head Circumference --      Peak Flow --      Pain Score 12/16/17 1959 6     Pain Loc --      Pain Edu? --       Excl. in GC? --      Constitutional: Alert and oriented. Well appearing and in no acute distress. Eyes: Conjunctivae are normal. PERRL. EOMI. Head: Atraumatic. Neck: No stridor.  No cervical spine tenderness to palpation.  Cardiovascular: Normal rate, regular rhythm. Normal S1 and S2.  Good peripheral circulation. Respiratory: Normal respiratory effort without tachypnea or retractions. Lungs CTAB. Good air entry to the bases with no decreased or absent breath sounds. Musculoskeletal: Limited range of motion to the right upper extremity, otherwise full range of motion all extremities.  No gross deformities appreciated.  Visualization of the right upper extremity reveals gross edema to the right elbow and compared to left.  No lacerations or abrasions noted.  No range of motion due to pain.  Palpation reveals significant tenderness to palpation over the distal humerus, olecranon process, medial and lateral left condyle, proximal radial head.  Questionable palpable deformity noted posterior elbow over the distal humerus/olecranon process.  No passive range of motion is attempted at this time.  Radial pulse intact distally.  Diffuse tenderness to palpation over the palmar aspect of the right hand, as well as fourth and fifth MCP joints.  No palpable abnormality in this region.  Full range of motion to the wrist, all 5 digits.  Sensation intact all 5 digits.  Capillary refill less than 2 seconds all digits. Neurologic:  Normal speech and language. No gross focal neurologic deficits are appreciated.  Skin:  Skin is warm, dry and intact. No rash noted. Psychiatric: Mood and affect are normal. Speech and behavior are normal. Patient exhibits appropriate insight and judgement.   ____________________________________________   LABS (all labs ordered are listed, but only abnormal results are displayed)  Labs Reviewed - No data to  display ____________________________________________  EKG   ____________________________________________  RADIOLOGY I personally viewed and evaluated these images as part of my medical decision making, as well as reviewing the written report by the radiologist.  I concur with radiologist finding of radial head fracture with joint effusion.  Patient also has a minimally displaced proximal fifth metacarpal fracture.  Dg Elbow Complete Right  Result Date: 12/16/2017 CLINICAL DATA:  Post fall with right arm pain. Pain about the elbow, wrist and hand. EXAM: RIGHT ELBOW - COMPLETE 3+ VIEW COMPARISON:  None. FINDINGS: Mildly displaced radial head fracture with intra-articular extension. Moderate associated joint effusion, likely lipohemarthrosis. No additional acute fracture of the elbow. IMPRESSION: Displaced intra-articular radial head fracture with moderate joint effusion. Electronically Signed   By: Narda Rutherford M.D.   On: 12/16/2017 21:07   Dg Hand Complete Right  Result Date: 12/16/2017 CLINICAL DATA:  Post fall with right arm pain. Pain about the elbow, wrist and hand. EXAM: RIGHT HAND - COMPLETE  3+ VIEW COMPARISON:  None. FINDINGS: Mildly comminuted and minimally displaced proximal fifth metacarpal shaft fracture. No evidence of intra-articular extension. No additional acute fracture of the hand. 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 Small well corticated osseous density distal to the ulna styloid may be sequela of remote prior injury or an accessory ossicle. IMPRESSION: Mildly comminuted and minimally displaced proximal fifth metacarpal shaft fracture without intra-articular extension. Electronically Signed   By: Narda Rutherford M.D.   On: 12/16/2017 21:09    ____________________________________________    PROCEDURES  Procedure(s) performed:    .Splint Application Date/Time: 12/16/2017 10:18  PM Performed by: Racheal Patches, PA-C Authorized by: Racheal Patches, PA-C   Consent:    Consent obtained:  Verbal   Consent given by:  Patient   Risks discussed:  Pain and swelling Pre-procedure details:    Sensation:  Normal Procedure details:    Laterality:  Right   Location:  Elbow   Elbow:  R elbow   Splint type:  Long arm   Supplies:  Cotton padding, Ortho-Glass, elastic bandage and sling Post-procedure details:    Pain:  Improved   Sensation:  Normal   Patient tolerance of procedure:  Tolerated well, no immediate complications .Splint Application Date/Time: 12/16/2017 10:18 PM Performed by: Racheal Patches, PA-C Authorized by: Racheal Patches, PA-C   Consent:    Consent obtained:  Verbal   Consent given by:  Patient   Risks discussed:  Pain and swelling Pre-procedure details:    Sensation:  Normal Procedure details:    Laterality:  Right   Location:  Hand   Hand:  R hand   Splint type:  Ulnar gutter   Supplies:  Cotton padding, Ortho-Glass and elastic bandage Post-procedure details:    Pain:  Improved   Sensation:  Normal   Patient tolerance of procedure:  Tolerated well, no immediate complications .Foreign Body Removal Date/Time: 12/16/2017 10:19 PM Performed by: Racheal Patches, PA-C Authorized by: Racheal Patches, PA-C  Consent: Verbal consent obtained. Risks and benefits: risks, benefits and alternatives were discussed Consent given by: patient Patient understanding: patient states understanding of the procedure being performed Patient identity confirmed: verbally with patient Body area: eye Location details: right cornea  Anesthesia: Local Anesthetic: tetracaine drops  Sedation: Patient sedated: no  Patient restrained: no Patient cooperative: yes Localization method: visualized Removal mechanism: eyelid eversion and moist cotton swab Eye not examined with fluorescein. Complexity: simple 1 objects  recovered. Objects recovered: contact lens Post-procedure assessment: foreign body removed Patient tolerance: Patient tolerated the procedure well with no immediate complications Comments: Given nature of right arm injury, patient was unable to remove right contact lens from her eye.  This was visualized, and placed with no evidence of trauma.  I was anesthetized using tetracaine, eyelid inversion with with cotton swab to remove.  This was placed in saline solution, returned to patient.      Medications  oxyCODONE-acetaminophen (PERCOCET/ROXICET) 5-325 MG per tablet 1 tablet (1 tablet Oral Given 12/16/17 2034)  ondansetron (ZOFRAN-ODT) disintegrating tablet 8 mg (8 mg Oral Given 12/16/17 2034)  tetracaine (PONTOCAINE) 0.5 % ophthalmic solution 2 drop (2 drops Right Eye Given by Other 12/16/17 2204)     ____________________________________________   INITIAL IMPRESSION / ASSESSMENT AND PLAN / ED COURSE  Pertinent labs & imaging results that were available during my care of the patient were reviewed by me and considered in my  medical decision making (see chart for details).  Review of the [place] CSRS was performed in accordance of the [facility] prior to dispensing any controlled drugs.      Patient's diagnosis is consistent with fall resulting in radial head fracture, fifth metacarpal fracture.  Patient presented to the emergency department after mechanical fall.  Patient's main complaint was right elbow given mechanism of injury, patient's elbow, wrist and hand were imaged.  Patient had a radial head fracture with minimal displacement as well as minimally displaced fifth metacarpal fracture.  These were mobilized as described above.  Follow-up instructions are discussed with patient.  She will follow-up with orthopedics for further management of these fractures.  Patient will be prescribed Percocet for pain relief.  Patient did have a contact lens in her right eye, she was unable to remove same given  her injury.  As such, contact was removed in the emergency department, and contact lens was placed in saline solution and return to the patient..  Patient is to follow-up with orthopedics as described above.  Patient is given ED precautions to return to the ED for any worsening or new symptoms.     ____________________________________________  FINAL CLINICAL IMPRESSION(S) / ED DIAGNOSES  Final diagnoses:  Closed displaced fracture of head of right radius, initial encounter  Closed displaced fracture of shaft of fifth metacarpal bone of right hand, initial encounter      NEW MEDICATIONS STARTED DURING THIS VISIT:  ED Discharge Orders         Ordered    oxyCODONE-acetaminophen (PERCOCET/ROXICET) 5-325 MG tablet  Every 6 hours PRN     12/16/17 2210              This chart was dictated using voice recognition software/Dragon. Despite best efforts to proofread, errors can occur which can change the meaning. Any change was purely unintentional.    Racheal Patches, PA-C 12/16/17 2222    Myrna Blazer, MD 12/17/17 [phone]

## 2017-12-16 NOTE — ED Triage Notes (Signed)
Pt comes via POV from home with c/o arm injury. Pt states she stumbled over a broken piece of sidewalk. Pt denies LOC, nor hitting head. Pt states left arm pain. Pt has swelling noted to left hand, fingers, and elbow. Pt able to move all fingers and able to move arm some. Pt unable to lift arm or straighten out arm. No visual deformity noted at this time

## 2017-12-16 NOTE — ED Notes (Signed)
Pt states that she was walking down the street and stepped on a missing part of the sidewalk and fell on her right leg. Pt states that she is not able to move arm very much but hurts mainly in the elbow. Pt able to wiggle fingers and cap refill <3.

## 2018-11-07 ENCOUNTER — Encounter: Payer: Self-pay | Admitting: Family Medicine

## 2018-11-07 ENCOUNTER — Ambulatory Visit: Payer: PRIVATE HEALTH INSURANCE | Admitting: Family Medicine

## 2018-11-07 ENCOUNTER — Other Ambulatory Visit: Payer: Self-pay

## 2018-11-07 VITALS — BP 110/60 | HR 80 | Ht 61.0 in | Wt 181.0 lb

## 2018-11-07 DIAGNOSIS — H9193 Unspecified hearing loss, bilateral: Secondary | ICD-10-CM

## 2018-11-07 DIAGNOSIS — H6123 Impacted cerumen, bilateral: Secondary | ICD-10-CM | POA: Diagnosis not present

## 2018-11-07 NOTE — Progress Notes (Signed)
Date:  11/07/2018   Name:  Monica Mckenzie   DOB:  1955-10-20   MRN:  [id number]   Chief Complaint: Ear Fullness (has allergies- L) ear has been stopped up for over a month- using Debrox ear drops. Now R) ear is stopped up- can't hear)  Ear Fullness  There is pain in both ears. This is a new problem. The current episode started more than 1 month ago. The problem has been gradually worsening. There has been no fever. The pain is at a severity of 0/10. The patient is experiencing no pain. Pertinent negatives include no abdominal pain, coughing, diarrhea, ear discharge, headaches, hearing loss, neck pain, rash, rhinorrhea, sore throat or vomiting. She has tried ear drops for the symptoms. The treatment provided no relief. Her past medical history is significant for hearing loss. There is no history of a chronic ear infection or a tympanostomy tube.    Review of Systems  Constitutional: Negative.  Negative for chills, fatigue, fever and unexpected weight change.  HENT: Negative for congestion, ear discharge, ear pain, hearing loss, rhinorrhea, sinus pressure, sneezing, sore throat and tinnitus.   Eyes: Negative for photophobia, pain, discharge, redness and itching.  Respiratory: Negative for cough, shortness of breath, wheezing and stridor.   Gastrointestinal: Negative for abdominal pain, blood in stool, constipation, diarrhea, nausea and vomiting.  Endocrine: Negative for cold intolerance, heat intolerance, polydipsia, polyphagia and polyuria.  Genitourinary: Negative for dysuria, flank pain, frequency, hematuria, menstrual problem, pelvic pain, urgency, vaginal bleeding and vaginal discharge.  Musculoskeletal: Negative for arthralgias, back pain, myalgias and neck pain.  Skin: Negative for rash.  Allergic/Immunologic: Negative for environmental allergies and food allergies.  Neurological: Negative for dizziness, weakness, light-headedness, numbness and headaches.  Hematological: Negative for  adenopathy. Does not bruise/bleed easily.  Psychiatric/Behavioral: Negative for dysphoric mood. The patient is not nervous/anxious.     Patient Active Problem List   Diagnosis Date Noted  . Encounter to establish care with new doctor 03/03/2015    No Known Allergies  Past Surgical History:  Procedure Laterality Date  . COLONOSCOPY  2012   cleared for 10 yrs- Dr Candace Cruise    Social History   Tobacco Use  . Smoking status: Former Research scientist (life sciences)  . Smokeless tobacco: Never Used  Substance Use Topics  . Alcohol use: Yes    Alcohol/week: 0.0 standard drinks  . Drug use: No     Medication list has been reviewed and updated.  Current Meds  Medication Sig  . Calcium Carb-Cholecalciferol (CALTRATE 600+D3 SOFT PO) Take 1 tablet by mouth daily.  . Multiple Vitamins-Minerals (CENTRUM SILVER ULTRA WOMENS PO) Take 1 tablet by mouth daily.  . Phenazopyridine HCl (AZO TABS PO) Take 2 capsules by mouth daily.    PHQ 2/9 Scores 11/07/2018 04/07/2015 03/03/2015  PHQ - 2 Score 0 0 0  PHQ- 9 Score 0 - -    BP Readings from Last 3 Encounters:  11/07/18 110/60  12/16/17 132/74  12/22/15 114/64    Physical Exam Vitals signs and nursing note reviewed.  Constitutional:      General: She is not in acute distress.    Appearance: She is not diaphoretic.  HENT:     Head: Normocephalic and atraumatic.     Right Ear: External ear normal. There is impacted cerumen.     Left Ear: External ear normal. There is impacted cerumen.     Nose: Nose normal.     Mouth/Throat:     Mouth: Mucous membranes are  moist.  Eyes:     General:        Right eye: No discharge.        Left eye: No discharge.     Conjunctiva/sclera: Conjunctivae normal.     Pupils: Pupils are equal, round, and reactive to light.  Neck:     Musculoskeletal: Normal range of motion and neck supple.     Thyroid: No thyromegaly.     Vascular: No JVD.  Cardiovascular:     Rate and Rhythm: Normal rate and regular rhythm.     Heart sounds:  Normal heart sounds, S1 normal and S2 normal. No murmur. No systolic murmur. No diastolic murmur. No friction rub. No gallop. No S3 or S4 sounds.   Pulmonary:     Effort: Pulmonary effort is normal.     Breath sounds: Normal breath sounds. No wheezing or rhonchi.  Abdominal:     General: Bowel sounds are normal.     Palpations: Abdomen is soft. There is no mass.     Tenderness: There is no abdominal tenderness. There is no guarding.  Musculoskeletal: Normal range of motion.  Lymphadenopathy:     Cervical: No cervical adenopathy.  Skin:    General: Skin is warm and dry.  Neurological:     Mental Status: She is alert.     Deep Tendon Reflexes: Reflexes are normal and symmetric.     Wt Readings from Last 3 Encounters:  11/07/18 181 lb (82.1 kg)  12/16/17 180 lb (81.6 kg)  12/22/15 186 lb (84.4 kg)    BP 110/60   Pulse 80   Ht 5\' 1"  (1.549 m)   Wt 181 lb (82.1 kg)   BMI 34.20 kg/m  Patient is noted over the past 2 weeks a decrease in hearing acuity in both ears. Assessment and Plan:  1. Acute hearing loss of both ears Patient had gradual decrease in hearing in both ears over the course of 2 weeks.  Patient has been using Debrox but has been unable to dislodge suspected cerumen impaction.  This was confirmed on examination and irrigation of both ears was initiated.  2. Bilateral impacted cerumen Irrigation by earwax removal system with low pressure irrigation was initiated.  There was removal of wax from both ears to a significant extent with recovered visibility of both tympanic membranes.  Hearing acuity was verified as improved upon procedure completion.

## 2020-03-25 DIAGNOSIS — R5383 Other fatigue: Secondary | ICD-10-CM | POA: Diagnosis not present

## 2020-03-25 DIAGNOSIS — J302 Other seasonal allergic rhinitis: Secondary | ICD-10-CM | POA: Diagnosis not present

## 2020-03-25 DIAGNOSIS — R635 Abnormal weight gain: Secondary | ICD-10-CM | POA: Diagnosis not present

## 2020-03-28 ENCOUNTER — Other Ambulatory Visit: Payer: Self-pay

## 2020-03-28 ENCOUNTER — Emergency Department
Admission: EM | Admit: 2020-03-28 | Discharge: 2020-03-28 | Disposition: A | Discharge status: Home / Self Care | Payer: Medicare HMO | Source: Home / Self Care

## 2020-03-28 DIAGNOSIS — R1110 Vomiting, unspecified: Secondary | ICD-10-CM | POA: Insufficient documentation

## 2020-03-28 DIAGNOSIS — R1032 Left lower quadrant pain: Secondary | ICD-10-CM | POA: Insufficient documentation

## 2020-03-28 DIAGNOSIS — R197 Diarrhea, unspecified: Secondary | ICD-10-CM | POA: Insufficient documentation

## 2020-03-28 DIAGNOSIS — Z5321 Procedure and treatment not carried out due to patient leaving prior to being seen by health care provider: Secondary | ICD-10-CM | POA: Insufficient documentation

## 2020-03-28 DIAGNOSIS — K5720 Diverticulitis of large intestine with perforation and abscess without bleeding: Secondary | ICD-10-CM | POA: Diagnosis not present

## 2020-03-28 LAB — CBC
HCT: 46 % (ref 36.0–46.0)
Hemoglobin: 15.1 g/dL — ABNORMAL HIGH (ref 12.0–15.0)
MCH: 29.6 pg (ref 26.0–34.0)
MCHC: 32.8 g/dL (ref 30.0–36.0)
MCV: 90.2 fL (ref 80.0–100.0)
Platelets: 292 10*3/uL (ref 150–400)
RBC: 5.1 MIL/uL (ref 3.87–5.11)
RDW: 13.2 % (ref 11.5–15.5)
WBC: 17.9 10*3/uL — ABNORMAL HIGH (ref 4.0–10.5)
nRBC: 0 % (ref 0.0–0.2)

## 2020-03-28 LAB — COMPREHENSIVE METABOLIC PANEL
ALT: 22 U/L (ref 0–44)
AST: 24 U/L (ref 15–41)
Albumin: 3 g/dL — ABNORMAL LOW (ref 3.5–5.0)
Alkaline Phosphatase: 59 U/L (ref 38–126)
Anion gap: 14 (ref 5–15)
BUN: 22 mg/dL (ref 8–23)
CO2: 24 mmol/L (ref 22–32)
Calcium: 8.9 mg/dL (ref 8.9–10.3)
Chloride: 102 mmol/L (ref 98–111)
Creatinine, Ser: 0.81 mg/dL (ref 0.44–1.00)
GFR, Estimated: >60 mL/min (ref >60)
Glucose, Bld: 142 mg/dL — ABNORMAL HIGH (ref 70–99)
Potassium: 3.9 mmol/L (ref 3.5–5.1)
Sodium: 140 mmol/L (ref 135–145)
Total Bilirubin: 1.1 mg/dL (ref 0.3–1.2)
Total Protein: 7.6 g/dL (ref 6.5–8.1)

## 2020-03-28 LAB — LIPASE, BLOOD: Lipase: 30 U/L (ref 11–51)

## 2020-03-28 NOTE — ED Triage Notes (Signed)
Pt in with co left lower quad pain since Thursday. Has had vomiting and diarrhea since. States not able to keep food or fluids down.

## 2020-03-29 ENCOUNTER — Encounter
Admission: EM | Disposition: A | Discharge status: Home / Self Care | Payer: Self-pay | Source: Home / Self Care | Attending: Surgery

## 2020-03-29 ENCOUNTER — Other Ambulatory Visit: Payer: Self-pay

## 2020-03-29 ENCOUNTER — Inpatient Hospital Stay: Payer: Medicare HMO | Admitting: Certified Registered Nurse Anesthetist

## 2020-03-29 ENCOUNTER — Emergency Department: Payer: Medicare HMO

## 2020-03-29 ENCOUNTER — Inpatient Hospital Stay
Admission: EM | Admit: 2020-03-29 | Discharge: 2020-04-05 | DRG: 330 | Disposition: A | Discharge status: Home Health | Payer: Medicare HMO | Attending: Surgery | Admitting: Surgery

## 2020-03-29 DIAGNOSIS — Z79899 Other long term (current) drug therapy: Secondary | ICD-10-CM | POA: Diagnosis not present

## 2020-03-29 DIAGNOSIS — Z803 Family history of malignant neoplasm of breast: Secondary | ICD-10-CM

## 2020-03-29 DIAGNOSIS — Z833 Family history of diabetes mellitus: Secondary | ICD-10-CM | POA: Diagnosis not present

## 2020-03-29 DIAGNOSIS — J45909 Unspecified asthma, uncomplicated: Secondary | ICD-10-CM | POA: Diagnosis present

## 2020-03-29 DIAGNOSIS — K760 Fatty (change of) liver, not elsewhere classified: Secondary | ICD-10-CM | POA: Diagnosis not present

## 2020-03-29 DIAGNOSIS — K5780 Diverticulitis of intestine, part unspecified, with perforation and abscess without bleeding: Secondary | ICD-10-CM | POA: Diagnosis not present

## 2020-03-29 DIAGNOSIS — N736 Female pelvic peritoneal adhesions (postinfective): Secondary | ICD-10-CM | POA: Diagnosis present

## 2020-03-29 DIAGNOSIS — Z8249 Family history of ischemic heart disease and other diseases of the circulatory system: Secondary | ICD-10-CM

## 2020-03-29 DIAGNOSIS — K567 Ileus, unspecified: Secondary | ICD-10-CM | POA: Diagnosis not present

## 2020-03-29 DIAGNOSIS — Z20822 Contact with and (suspected) exposure to covid-19: Secondary | ICD-10-CM | POA: Diagnosis present

## 2020-03-29 DIAGNOSIS — Z87891 Personal history of nicotine dependence: Secondary | ICD-10-CM | POA: Diagnosis not present

## 2020-03-29 DIAGNOSIS — K5720 Diverticulitis of large intestine with perforation and abscess without bleeding: Principal | ICD-10-CM | POA: Diagnosis present

## 2020-03-29 DIAGNOSIS — R935 Abnormal findings on diagnostic imaging of other abdominal regions, including retroperitoneum: Secondary | ICD-10-CM

## 2020-03-29 DIAGNOSIS — R188 Other ascites: Secondary | ICD-10-CM | POA: Diagnosis present

## 2020-03-29 DIAGNOSIS — D72829 Elevated white blood cell count, unspecified: Secondary | ICD-10-CM

## 2020-03-29 DIAGNOSIS — Z432 Encounter for attention to ileostomy: Secondary | ICD-10-CM | POA: Diagnosis not present

## 2020-03-29 DIAGNOSIS — R1032 Left lower quadrant pain: Secondary | ICD-10-CM

## 2020-03-29 DIAGNOSIS — T8189XA Other complications of procedures, not elsewhere classified, initial encounter: Secondary | ICD-10-CM | POA: Diagnosis not present

## 2020-03-29 DIAGNOSIS — R109 Unspecified abdominal pain: Secondary | ICD-10-CM | POA: Diagnosis not present

## 2020-03-29 DIAGNOSIS — E669 Obesity, unspecified: Secondary | ICD-10-CM | POA: Diagnosis present

## 2020-03-29 DIAGNOSIS — N735 Female pelvic peritonitis, unspecified: Secondary | ICD-10-CM | POA: Diagnosis present

## 2020-03-29 DIAGNOSIS — Z6835 Body mass index (BMI) 35.0-35.9, adult: Secondary | ICD-10-CM

## 2020-03-29 DIAGNOSIS — Z932 Ileostomy status: Secondary | ICD-10-CM | POA: Diagnosis not present

## 2020-03-29 HISTORY — PX: COLECTOMY WITH COLOSTOMY CREATION/HARTMANN PROCEDURE: SHX6598

## 2020-03-29 LAB — COMPREHENSIVE METABOLIC PANEL
ALT: 19 U/L (ref 0–44)
AST: 19 U/L (ref 15–41)
Albumin: 2.9 g/dL — ABNORMAL LOW (ref 3.5–5.0)
Alkaline Phosphatase: 60 U/L (ref 38–126)
Anion gap: 13 (ref 5–15)
BUN: 23 mg/dL (ref 8–23)
CO2: 23 mmol/L (ref 22–32)
Calcium: 9.1 mg/dL (ref 8.9–10.3)
Chloride: 105 mmol/L (ref 98–111)
Creatinine, Ser: 0.75 mg/dL (ref 0.44–1.00)
GFR, Estimated: >60 mL/min (ref >60)
Glucose, Bld: 147 mg/dL — ABNORMAL HIGH (ref 70–99)
Potassium: 3.8 mmol/L (ref 3.5–5.1)
Sodium: 141 mmol/L (ref 135–145)
Total Bilirubin: 1 mg/dL (ref 0.3–1.2)
Total Protein: 7.3 g/dL (ref 6.5–8.1)

## 2020-03-29 LAB — URINALYSIS, COMPLETE (UACMP) WITH MICROSCOPIC
Bilirubin Urine: NEGATIVE
Glucose, UA: NEGATIVE mg/dL
Ketones, ur: 20 mg/dL — AB
Leukocytes,Ua: NEGATIVE
Nitrite: NEGATIVE
Protein, ur: NEGATIVE mg/dL
Specific Gravity, Urine: >1.046 — ABNORMAL HIGH (ref 1.005–1.030)
pH: 5 (ref 5.0–8.0)

## 2020-03-29 LAB — TYPE AND SCREEN
ABO/RH(D): O POS
Antibody Screen: NEGATIVE

## 2020-03-29 LAB — CBC
HCT: 45.9 % (ref 36.0–46.0)
Hemoglobin: 15.3 g/dL — ABNORMAL HIGH (ref 12.0–15.0)
MCH: 30.1 pg (ref 26.0–34.0)
MCHC: 33.3 g/dL (ref 30.0–36.0)
MCV: 90.2 fL (ref 80.0–100.0)
Platelets: 289 10*3/uL (ref 150–400)
RBC: 5.09 MIL/uL (ref 3.87–5.11)
RDW: 13.5 % (ref 11.5–15.5)
WBC: 16.2 10*3/uL — ABNORMAL HIGH (ref 4.0–10.5)
nRBC: 0 % (ref 0.0–0.2)

## 2020-03-29 LAB — LIPASE, BLOOD: Lipase: 42 U/L (ref 11–51)

## 2020-03-29 LAB — RESP PANEL BY RT-PCR (FLU A&B, COVID) ARPGX2
Influenza A by PCR: NEGATIVE
Influenza B by PCR: NEGATIVE
SARS Coronavirus 2 by RT PCR: NEGATIVE

## 2020-03-29 LAB — ABO/RH: ABO/RH(D): O POS

## 2020-03-29 SURGERY — COLECTOMY, WITH COLOSTOMY CREATION
Anesthesia: General

## 2020-03-29 MED ORDER — FENTANYL CITRATE (PF) 100 MCG/2ML IJ SOLN
INTRAMUSCULAR | Status: AC
  Filled 2020-03-29: qty 2

## 2020-03-29 MED ORDER — MIDAZOLAM HCL 2 MG/2ML IJ SOLN
INTRAMUSCULAR | Status: AC
  Filled 2020-03-29: qty 2

## 2020-03-29 MED ORDER — SUCCINYLCHOLINE CHLORIDE 200 MG/10ML IV SOSY
PREFILLED_SYRINGE | INTRAVENOUS | Status: AC
  Filled 2020-03-29: qty 10

## 2020-03-29 MED ORDER — ONDANSETRON HCL 4 MG/2ML IJ SOLN: 4.0000 mg | Freq: Four times a day (QID) | INTRAMUSCULAR | Status: DC | PRN

## 2020-03-29 MED ORDER — SUCCINYLCHOLINE CHLORIDE 20 MG/ML IJ SOLN
INTRAMUSCULAR | Status: DC | PRN
  Administered 2020-03-29: 100 mg via INTRAVENOUS

## 2020-03-29 MED ORDER — SEVOFLURANE IN SOLN
RESPIRATORY_TRACT | Status: AC
  Filled 2020-03-29: qty 250

## 2020-03-29 MED ORDER — ACETAMINOPHEN 10 MG/ML IV SOLN: 1000.0000 mg | Freq: Four times a day (QID) | INTRAVENOUS | Status: AC

## 2020-03-29 MED ORDER — DEXAMETHASONE SODIUM PHOSPHATE 10 MG/ML IJ SOLN
INTRAMUSCULAR | Status: DC | PRN
  Administered 2020-03-29: 10 mg via INTRAVENOUS

## 2020-03-29 MED ORDER — FENTANYL CITRATE (PF) 100 MCG/2ML IJ SOLN
25.0000 ug | INTRAMUSCULAR | Status: AC | PRN
  Administered 2020-03-29 (×4): 25 ug via INTRAVENOUS

## 2020-03-29 MED ORDER — LACTATED RINGERS IV BOLUS
1000.0000 mL | Freq: Once | INTRAVENOUS | Status: AC
  Administered 2020-03-29: 1000 mL via INTRAVENOUS

## 2020-03-29 MED ORDER — SODIUM CHLORIDE 0.9 % IV SOLN
INTRAVENOUS | Status: DC | PRN
  Administered 2020-03-29: 20 ug/min via INTRAVENOUS

## 2020-03-29 MED ORDER — LIDOCAINE HCL (PF) 2 % IJ SOLN
INTRAMUSCULAR | Status: AC
  Filled 2020-03-29: qty 5

## 2020-03-29 MED ORDER — ROCURONIUM BROMIDE 10 MG/ML (PF) SYRINGE
PREFILLED_SYRINGE | INTRAVENOUS | Status: AC
  Filled 2020-03-29: qty 10

## 2020-03-29 MED ORDER — KETAMINE HCL 50 MG/5ML IJ SOSY
PREFILLED_SYRINGE | INTRAMUSCULAR | Status: AC
  Filled 2020-03-29: qty 5

## 2020-03-29 MED ORDER — SUGAMMADEX SODIUM 200 MG/2ML IV SOLN
INTRAVENOUS | Status: DC | PRN
  Administered 2020-03-29: 200 mg via INTRAVENOUS

## 2020-03-29 MED ORDER — ROCURONIUM BROMIDE 100 MG/10ML IV SOLN
INTRAVENOUS | Status: DC | PRN
  Administered 2020-03-29: 30 mg via INTRAVENOUS
  Administered 2020-03-29: 10 mg via INTRAVENOUS
  Administered 2020-03-29: 20 mg via INTRAVENOUS
  Administered 2020-03-29 (×2): 10 mg via INTRAVENOUS

## 2020-03-29 MED ORDER — HYDROMORPHONE HCL 1 MG/ML IJ SOLN
INTRAMUSCULAR | Status: AC
  Administered 2020-03-29: 0.25 mg via INTRAVENOUS
  Filled 2020-03-29: qty 1

## 2020-03-29 MED ORDER — DEXMEDETOMIDINE (PRECEDEX) IN NS 20 MCG/5ML (4 MCG/ML) IV SYRINGE
PREFILLED_SYRINGE | INTRAVENOUS | Status: DC | PRN
  Administered 2020-03-29 (×3): 4 ug via INTRAVENOUS

## 2020-03-29 MED ORDER — ONDANSETRON HCL 4 MG/2ML IJ SOLN
INTRAMUSCULAR | Status: DC | PRN
  Administered 2020-03-29: 4 mg via INTRAVENOUS

## 2020-03-29 MED ORDER — KETOROLAC TROMETHAMINE 15 MG/ML IJ SOLN
INTRAMUSCULAR | Status: AC
  Administered 2020-03-29: 15 mg via INTRAVENOUS
  Filled 2020-03-29: qty 1

## 2020-03-29 MED ORDER — PROPOFOL 10 MG/ML IV BOLUS
INTRAVENOUS | Status: DC | PRN
  Administered 2020-03-29: 150 mg via INTRAVENOUS

## 2020-03-29 MED ORDER — DEXMEDETOMIDINE (PRECEDEX) IN NS 20 MCG/5ML (4 MCG/ML) IV SYRINGE
PREFILLED_SYRINGE | INTRAVENOUS | Status: AC
  Filled 2020-03-29: qty 5

## 2020-03-29 MED ORDER — LIDOCAINE HCL (CARDIAC) PF 100 MG/5ML IV SOSY
PREFILLED_SYRINGE | INTRAVENOUS | Status: DC | PRN
  Administered 2020-03-29 (×2): 20 mg via INTRAVENOUS
  Administered 2020-03-29: 100 mg via INTRAVENOUS

## 2020-03-29 MED ORDER — HYDROMORPHONE HCL 1 MG/ML IJ SOLN
0.2500 mg | INTRAMUSCULAR | Status: DC | PRN
Start: 2020-03-29 — End: 2020-03-29
  Administered 2020-03-29 (×3): 0.25 mg via INTRAVENOUS

## 2020-03-29 MED ORDER — MIDAZOLAM HCL 2 MG/2ML IJ SOLN
INTRAMUSCULAR | Status: DC | PRN
  Administered 2020-03-29: 2 mg via INTRAVENOUS

## 2020-03-29 MED ORDER — PIPERACILLIN-TAZOBACTAM 3.375 G IVPB
INTRAVENOUS | Status: AC
  Administered 2020-03-29: 3.375 g via INTRAVENOUS
  Filled 2020-03-29: qty 50

## 2020-03-29 MED ORDER — ACETAMINOPHEN 10 MG/ML IV SOLN
INTRAVENOUS | Status: AC
  Filled 2020-03-29: qty 100

## 2020-03-29 MED ORDER — MICROFIBRILLAR COLL HEMOSTAT EX PADS
MEDICATED_PAD | CUTANEOUS | Status: DC | PRN
  Administered 2020-03-29 (×2): 1 via TOPICAL

## 2020-03-29 MED ORDER — HYDROMORPHONE HCL 1 MG/ML IJ SOLN
0.5000 mg | Freq: Once | INTRAMUSCULAR | Status: AC
  Administered 2020-03-29: 0.5 mg via INTRAVENOUS
  Filled 2020-03-29: qty 1

## 2020-03-29 MED ORDER — BUPIVACAINE LIPOSOME 1.3 % IJ SUSP
INTRAMUSCULAR | Status: DC | PRN
  Administered 2020-03-29: 20 mL

## 2020-03-29 MED ORDER — ONDANSETRON HCL 4 MG/2ML IJ SOLN
INTRAMUSCULAR | Status: AC
  Filled 2020-03-29: qty 2

## 2020-03-29 MED ORDER — ONDANSETRON 4 MG PO TBDP: 4.0000 mg | ORAL_TABLET | Freq: Four times a day (QID) | ORAL | Status: DC | PRN

## 2020-03-29 MED ORDER — HYDROMORPHONE HCL 1 MG/ML IJ SOLN
0.5000 mg | INTRAMUSCULAR | Status: DC | PRN
  Administered 2020-03-29: 0.5 mg via INTRAVENOUS

## 2020-03-29 MED ORDER — PHENYLEPHRINE HCL (PRESSORS) 10 MG/ML IV SOLN
INTRAVENOUS | Status: DC | PRN
  Administered 2020-03-29 (×4): 100 ug via INTRAVENOUS

## 2020-03-29 MED ORDER — KETAMINE HCL 10 MG/ML IJ SOLN
INTRAMUSCULAR | Status: DC | PRN
  Administered 2020-03-29: 10 mg via INTRAVENOUS
  Administered 2020-03-29: 15 mg via INTRAVENOUS
  Administered 2020-03-29: 25 mg via INTRAVENOUS

## 2020-03-29 MED ORDER — IOHEXOL 300 MG/ML  SOLN
100.0000 mL | Freq: Once | INTRAMUSCULAR | Status: AC | PRN
  Administered 2020-03-29: 100 mL via INTRAVENOUS
  Filled 2020-03-29: qty 100

## 2020-03-29 MED ORDER — SODIUM CHLORIDE (PF) 0.9 % IJ SOLN
INTRAMUSCULAR | Status: DC | PRN
  Administered 2020-03-29: 50 mL

## 2020-03-29 MED ORDER — FENTANYL CITRATE (PF) 100 MCG/2ML IJ SOLN
INTRAMUSCULAR | Status: DC | PRN
  Administered 2020-03-29: 50 ug via INTRAVENOUS
  Administered 2020-03-29: 25 ug via INTRAVENOUS
  Administered 2020-03-29: 75 ug via INTRAVENOUS
  Administered 2020-03-29 (×4): 25 ug via INTRAVENOUS

## 2020-03-29 MED ORDER — PANTOPRAZOLE SODIUM 40 MG IV SOLR
40.0000 mg | Freq: Every day | INTRAVENOUS | Status: DC
  Administered 2020-03-29 – 2020-04-04 (×7): 40 mg via INTRAVENOUS
  Filled 2020-03-29 (×10): qty 40

## 2020-03-29 MED ORDER — KETOROLAC TROMETHAMINE 30 MG/ML IJ SOLN
INTRAMUSCULAR | Status: DC | PRN
  Administered 2020-03-29: 30 mg via INTRAVENOUS

## 2020-03-29 MED ORDER — FENTANYL CITRATE (PF) 100 MCG/2ML IJ SOLN
INTRAMUSCULAR | Status: AC
  Administered 2020-03-29: 25 ug via INTRAVENOUS
  Filled 2020-03-29: qty 2

## 2020-03-29 MED ORDER — ACETAMINOPHEN 10 MG/ML IV SOLN
INTRAVENOUS | Status: DC | PRN
  Administered 2020-03-29: 1000 mg via INTRAVENOUS

## 2020-03-29 MED ORDER — KETOROLAC TROMETHAMINE 15 MG/ML IJ SOLN
15.0000 mg | Freq: Four times a day (QID) | INTRAMUSCULAR | Status: DC | PRN
  Administered 2020-03-31: 15 mg via INTRAVENOUS

## 2020-03-29 MED ORDER — PROMETHAZINE HCL 25 MG/ML IJ SOLN: 6.2500 mg | INTRAMUSCULAR | Status: DC | PRN

## 2020-03-29 MED ORDER — HYDROMORPHONE HCL 1 MG/ML IJ SOLN
INTRAMUSCULAR | Status: AC
  Filled 2020-03-29: qty 1

## 2020-03-29 MED ORDER — BUPIVACAINE-EPINEPHRINE 0.25% -1:200000 IJ SOLN
INTRAMUSCULAR | Status: DC | PRN
  Administered 2020-03-29: 30 mL

## 2020-03-29 MED ORDER — VISTASEAL 10 ML SINGLE DOSE KIT
PACK | CUTANEOUS | Status: AC
  Filled 2020-03-29: qty 10

## 2020-03-29 MED ORDER — DEXAMETHASONE SODIUM PHOSPHATE 10 MG/ML IJ SOLN
INTRAMUSCULAR | Status: AC
  Filled 2020-03-29: qty 1

## 2020-03-29 MED ORDER — ONDANSETRON HCL 4 MG/2ML IJ SOLN
4.0000 mg | Freq: Once | INTRAMUSCULAR | Status: AC
  Administered 2020-03-29: 4 mg via INTRAVENOUS
  Filled 2020-03-29: qty 2

## 2020-03-29 MED ORDER — SODIUM CHLORIDE 0.9 % IV SOLN: INTRAVENOUS | Status: DC

## 2020-03-29 MED ORDER — PIPERACILLIN-TAZOBACTAM 3.375 G IVPB 30 MIN
3.3750 g | Freq: Once | INTRAVENOUS | Status: AC
  Administered 2020-03-29: 3.375 g via INTRAVENOUS
  Filled 2020-03-29: qty 50

## 2020-03-29 MED ORDER — PIPERACILLIN-TAZOBACTAM 3.375 G IVPB
3.3750 g | Freq: Three times a day (TID) | INTRAVENOUS | Status: DC
  Administered 2020-03-29 – 2020-04-05 (×10): 3.375 g via INTRAVENOUS
  Filled 2020-03-29 (×6): qty 50

## 2020-03-29 MED ORDER — ACETAMINOPHEN 500 MG PO TABS: 1000.0000 mg | ORAL_TABLET | Freq: Four times a day (QID) | ORAL | Status: DC

## 2020-03-29 MED ORDER — ACETAMINOPHEN 10 MG/ML IV SOLN
INTRAVENOUS | Status: AC
  Administered 2020-03-30: 1000 mg via INTRAVENOUS
  Filled 2020-03-29: qty 100

## 2020-03-29 SURGICAL SUPPLY — 38 items
APL PRP STRL LF DISP 70% ISPRP (MISCELLANEOUS) ×1
BARRIER ADH SEPRAFILM 3INX5IN (MISCELLANEOUS) ×4 IMPLANT
BNDG GAUZE 4.5X4.1 6PLY STRL (MISCELLANEOUS) ×4 IMPLANT
BRR ADH 5X3 SEPRAFILM 2 SHT (MISCELLANEOUS) ×2
BULB RESERV EVAC DRAIN JP 100C (MISCELLANEOUS) ×2 IMPLANT
CHLORAPREP W/TINT 26 (MISCELLANEOUS) ×2 IMPLANT
COVER WAND RF STERILE (DRAPES) ×2 IMPLANT
DRAIN CHANNEL JP 19F (MISCELLANEOUS) ×2 IMPLANT
DRAPE LAPAROTOMY 100X77 ABD (DRAPES) ×2 IMPLANT
ELECT EZSTD 165MM 6.5IN (MISCELLANEOUS) ×2
ELECT REM PT RETURN 9FT ADLT (ELECTROSURGICAL) ×2
ELECTRODE EZSTD 165MM 6.5IN (MISCELLANEOUS) ×1 IMPLANT
ELECTRODE REM PT RTRN 9FT ADLT (ELECTROSURGICAL) ×1 IMPLANT
GAUZE SPONGE 4X4 12PLY STRL (GAUZE/BANDAGES/DRESSINGS) ×2 IMPLANT
GLOVE SURG ENC MOIS LTX SZ7 (GLOVE) ×4 IMPLANT
GOWN STRL REUS W/ TWL LRG LVL3 (GOWN DISPOSABLE) ×4 IMPLANT
GOWN STRL REUS W/TWL LRG LVL3 (GOWN DISPOSABLE) ×8
KIT OSTOMY 2 PC DRNBL 2.25 STR (WOUND CARE) ×1 IMPLANT
KIT OSTOMY DRAINABLE 2.25 STR (WOUND CARE) ×2
KIT TURNOVER KIT A (KITS) ×2 IMPLANT
LABEL OR SOLS (LABEL) ×2 IMPLANT
LIGASURE IMPACT 36 18CM CVD LR (INSTRUMENTS) ×2 IMPLANT
MANIFOLD NEPTUNE II (INSTRUMENTS) ×2 IMPLANT
NEEDLE HYPO 22GX1.5 SAFETY (NEEDLE) ×2 IMPLANT
NS IRRIG 1000ML POUR BTL (IV SOLUTION) ×2 IMPLANT
PACK BASIN MAJOR ARMC (MISCELLANEOUS) ×2 IMPLANT
PACK COLON CLEAN CLOSURE (MISCELLANEOUS) ×2 IMPLANT
SPONGE LAP 18X18 RF (DISPOSABLE) ×4 IMPLANT
STAPLER CUT CVD 40MM BLUE (STAPLE) ×2 IMPLANT
STAPLER SKIN PROX 35W (STAPLE) ×2 IMPLANT
SUT PDS AB 0 CT1 27 (SUTURE) ×6 IMPLANT
SUT SILK 2 0 (SUTURE) ×2
SUT SILK 2 0SH CR/8 30 (SUTURE) ×2 IMPLANT
SUT SILK 2-0 18XBRD TIE 12 (SUTURE) ×1 IMPLANT
SUT VIC AB 3-0 SH 27 (SUTURE) ×2
SUT VIC AB 3-0 SH 27X BRD (SUTURE) ×1 IMPLANT
SYR 20ML LL LF (SYRINGE) ×2 IMPLANT
TRAY FOLEY MTR SLVR 16FR STAT (SET/KITS/TRAYS/PACK) ×2 IMPLANT

## 2020-03-29 NOTE — Op Note (Addendum)
PROCEDURES: 1. Hartmann's Procedure ( sigmoid colectomy w end colostomy) 2. Takedown of  splenic flexure 3. Placement 19 FR blake pelvis  Pre-operative Diagnosis: perforated diverticulitis  Post-operative Diagnosis: same  Surgeon: Merri Ray Lanie Schelling   Assistants: Laqueta Due [facility] ( required due to the complexity of the case and for exposure) and Dr Aleen Campi ( required for expertise in identifying the ureter  Anesthesia: General endotracheal anesthesia  ASA Class: 3  Surgeon: Sterling Big , MD FACS  Anesthesia: Gen. with endotracheal tube  Findings: Perforated diverticulitis with gross purulence within the pelvis.  large amount of pus was encountered and there was spillage of purulence within the pelvis. Severe diffuse sigmoid diverticulitis , short mesentery and decrease colon mobility due to inflammatory response despite appropriately aggressive maneuvers to mobilize the splenic flexure and descending colon Free air dissecting along the mesentery Severe inflammatory response within pelvis and uterus and bilateral tubes and ovaries plastered to the sigmoid colon.  Dilated small bowel from severe inflammatory response\  Estimated Blood Loss: 100cc              Specimens: sigmoid colon       Complications: none               Condition: stable  Procedure Details  The patient was seen again in the Holding Room. The benefits, complications, treatment options, and expected outcomes were discussed with the patient. The risks of bleeding, infection, recurrence of symptoms, failure to resolve symptoms,  bowel injury, any of which could require further surgery were reviewed with the patient.   The patient was taken to Operating Room, identified and the procedure verified.  A Time Out was held and the above information confirmed.  Prior to the induction of general anesthesia, antibiotic prophylaxis was administered. VTE prophylaxis was in place. General endotracheal anesthesia was  then administered and tolerated well. After the induction, the abdomen was prepped with Chloraprep and draped in the sterile fashion. The patient was positioned in the supine position.  Generous midline laparotomy was performed with blade knife and electrocautery was used to dissect through subcutaneous tissue and the fascia was incised.  Abdominal cavity was entered without any evidence of injuries.  We did a generous laparotomy on our dissection.  We encountered  significant severe diverticulitis with a Two large pockets of pus. Upon entering the abdominal cavity we encountered purulent ascites,  The sigmoid colon was adhered to the pelvic wall.  With a finger fracturing for dissection and encounter an abscess( pus spilled into the pelvis) within the pelvis that we promptly aspirated it and cultured it.  The sigmoid was mobilized from the pelvis after incising the peritoneum and a white line of Toldt.  Addled to medial mobilization was performed in the standard fashion.  We also were able to take down the splenic flexure for adequate mobilization with electrocautery and a combination of LigaSure device.  Attention then was turned to the pelvis where our dissection when past the sacral promontory into the rectum.  There was significant adhesion inflammatory response within the pelvis.  The left ureter was identified and preserved at all times, initially I had difficulty identifying the ureter and I asked my partner Dr. Aleen Campi to scrub in , we confirmed that in fact the Left ureter was intact, no evidence of hematuria or any injuries were observed.  Using LigaSure device the mesentery of the sigmoid colon and mesial rectum was divided in the standard fashion.  A good window within the mid  rectum was created.  Using a contour stapler we divided the rectum in the standard fashion.  And then was turned to the proximal aspect where the mesentery of the sigmoid colon was divided with LigaSure device. I Elected  healthy area of the descending colon to divide it.  Using another load of the contour stapler we divided this portion.  Specimen was sent for permanent pathology.  We again run the bowel and inspected the abdomen showing no evidence of any injuries or leaks. Within the left lower quadrant we created a circular incision within the skin and identified the anterior rectus sheath.  The muscles were retracted and the peritoneum was incised.  Using a Tanja Port were able to exteriorize the descending colon in the standard fashion. We irrigated abdominal cavity using normal saline in the standard fashion.  To Prevent any adhesions we used multiple sheets of Seprafilm Liposomal Marcaine was injected on all incision sites under direct visualization. The fascia was closed using the small bite technique with 2 running PDS sutures. We irrigated the subcutaneous tissue and left the skin open due to the degree of contamination.Attention was turned to the left lower quadrant where we were able to mature the colostomy in the standard fashion with multiple interrupted 3-0 Vicryl.  We attempted to create a  Rosebud colostomy using the Brooke technique.  Unfortunately due to decrease ostomy mobility the ostomy was flushed to the skin.  Needle and laparotomy count were correct and there were no immediate complications.  Sterling Big, MD, FACS

## 2020-03-29 NOTE — ED Provider Notes (Signed)
[facility] Emergency Department Provider Note  ____________________________________________   Event Date/Time   First MD Initiated Contact with Patient 03/29/20 1003     (approximate)  I have reviewed the triage vital signs and the nursing notes.   HISTORY  Chief Complaint Abdominal Pain   HPI Monica Mckenzie is a 65 y.o. female with past medical history of asthma and environmental allergies who presents for assessment approximately 4 to 5 days of worsening left lower quadrant abdominal pain associate with nonbloody nonbilious vomiting and nonbloody diarrhea. Patient denies any fevers, chills, cough, chest pain, shortness of breath, back pain, urinary symptoms, headache, earache, sore throat, rash or extremity pain. No prior similar episodes. No alleviating aggravating factors. No recent travel. Patient does not use NSAIDs regularly denies daily EtOH or illicit drug use. No other acute concerns at this time. No prior similar episodes. No clearly getting a rating factors.         Past Medical History:  Diagnosis Date  . Allergy   . Asthma     Patient Active Problem List   Diagnosis Date Noted  . Encounter to establish care with new doctor 03/03/2015    Past Surgical History:  Procedure Laterality Date  . COLONOSCOPY  2012   cleared for 10 yrs- Dr Bluford Kaufmannh    Prior to Admission medications   Medication Sig Start Date End Date Taking? Authorizing Provider  Calcium Carb-Cholecalciferol (CALTRATE 600+D3 SOFT PO) Take 1 tablet by mouth daily.    [provider]  Multiple Vitamins-Minerals (CENTRUM SILVER ULTRA WOMENS PO) Take 1 tablet by mouth daily.    [provider]  Phenazopyridine HCl (AZO TABS PO) Take 2 capsules by mouth daily.    [provider]    Allergies Patient has no known allergies.  Family History  Problem Relation Age of Onset  . Cancer Mother   . Breast cancer Mother [id number]  . Diabetes Father   .  Hypertension Father     Social History Social History   Tobacco Use  . Smoking status: Former Games developermoker  . Smokeless tobacco: Never Used  Substance Use Topics  . Alcohol use: Yes    Alcohol/week: 0.0 standard drinks  . Drug use: No    Review of Systems  Review of Systems  Constitutional: Negative for chills and fever.  HENT: Negative for sore throat.   Eyes: Negative for pain.  Respiratory: Negative for cough and stridor.   Cardiovascular: Negative for chest pain.  Gastrointestinal: Positive for abdominal pain, diarrhea, nausea and vomiting.  Genitourinary: Negative for dysuria.  Musculoskeletal: Negative for myalgias.  Skin: Negative for rash.  Neurological: Negative for seizures, loss of consciousness and headaches.  Psychiatric/Behavioral: Negative for suicidal ideas.  All other systems reviewed and are negative.     ____________________________________________   PHYSICAL EXAM:  VITAL SIGNS: ED Triage Vitals  Enc Vitals Group     BP 03/29/20 0925 (!) 144/73     Pulse Rate 03/29/20 0925 75     Resp --      Temp 03/29/20 0925 98.1 F (36.7 C)     Temp src --      SpO2 03/29/20 0925 96 %     Weight 03/29/20 0923 190 lb (86.2 kg)     Height 03/29/20 0923 5\' 1"  (1.549 m)     Head Circumference --      Peak Flow --      Pain Score 03/29/20 0923 6     Pain Loc --  Pain Edu? --      Excl. in GC? --    Vitals:   03/29/20 0925 03/29/20 1100  BP: (!) 144/73 (!) 149/68  Pulse: 75 80  Resp:  16  Temp: 98.1 F (36.7 C)   SpO2: 96% 96%   Physical Exam Vitals and nursing note reviewed.  Constitutional:      General: She is not in acute distress.    Appearance: She is well-developed and well-nourished.  HENT:     Head: Normocephalic and atraumatic.     Right Ear: External ear normal.     Left Ear: External ear normal.     Nose: Nose normal.     Mouth/Throat:     Mouth: Mucous membranes are dry.  Eyes:     Conjunctiva/sclera: Conjunctivae normal.   Cardiovascular:     Rate and Rhythm: Normal rate and regular rhythm.     Heart sounds: No murmur heard.   Pulmonary:     Effort: Pulmonary effort is normal. No respiratory distress.     Breath sounds: Normal breath sounds.  Abdominal:     Palpations: Abdomen is soft.     Tenderness: There is abdominal tenderness in the left lower quadrant. There is no right CVA tenderness or left CVA tenderness.  Musculoskeletal:        General: No edema.     Cervical back: Neck supple.     Right lower leg: No edema.     Left lower leg: No edema.  Skin:    General: Skin is warm and dry.     Capillary Refill: Capillary refill takes 2 to 3 seconds.  Neurological:     Mental Status: She is alert and oriented to person, place, and time.  Psychiatric:        Mood and Affect: Mood and affect and mood normal.      ____________________________________________   LABS (all labs ordered are listed, but only abnormal results are displayed)  Labs Reviewed  COMPREHENSIVE METABOLIC PANEL - Abnormal; Notable for the following components:      Result Value   Glucose, Bld 147 (*)    Albumin 2.9 (*)    All other components within normal limits  CBC - Abnormal; Notable for the following components:   WBC 16.2 (*)    Hemoglobin 15.3 (*)    All other components within normal limits  CULTURE, BLOOD (ROUTINE X 2)  CULTURE, BLOOD (ROUTINE X 2)  RESP PANEL BY RT-PCR (FLU A&B, COVID) ARPGX2  LIPASE, BLOOD  URINALYSIS, COMPLETE (UACMP) WITH MICROSCOPIC   ____________________________________________  ____________________________________________  RADIOLOGY  ED MD interpretation: Evidence of perforated diverticulitis with possible small developing abscess  Official radiology report(s): CT ABDOMEN PELVIS W CONTRAST  Result Date: 03/29/2020 CLINICAL DATA:  Acute generalized abdominal pain. EXAM: CT ABDOMEN AND PELVIS WITH CONTRAST TECHNIQUE: Multidetector CT imaging of the abdomen and pelvis was performed  using the standard protocol following bolus administration of intravenous contrast. CONTRAST:  OMNIPAQUE IOHEXOL 300 MG/ML  SOLN COMPARISON:  None. FINDINGS: Lower chest: No acute abnormality. Hepatobiliary: No gallstones or biliary dilatation is noted. Hepatic steatosis is noted. Several hepatic cysts are noted. Pancreas: Unremarkable. No pancreatic ductal dilatation or surrounding inflammatory changes. Spleen: Probable cyst seen involving anterior portion of spleen. Spleen is otherwise unremarkable. Adrenals/Urinary Tract: Adrenal glands are unremarkable. Kidneys are normal, without renal calculi, focal lesion, or hydronephrosis. Bladder is unremarkable. Stomach/Bowel: The stomach is unremarkable. Dilated small bowel loop is noted in epigastric region which most  likely represents focal ileus. Sigmoid diverticulosis is noted. Inflammatory changes as well as free air is noted anteriorly in the pelvis, as well as fluid collection measuring 7.8 x 2.1 cm concerning for possible abscess. Given the location, these findings most likely represent perforated diverticulitis. There is seen some wall thickening of adjacent small bowel loops most consistent with secondary inflammation. Vascular/Lymphatic: Aortic atherosclerosis. No enlarged abdominal or pelvic lymph nodes. Reproductive: Uterus and bilateral adnexa are unremarkable. Other: Small fat containing periumbilical hernia is noted. Musculoskeletal: No acute or significant osseous findings. IMPRESSION: 1. Sigmoid diverticulosis is noted. Inflammatory changes as well as free air is noted anteriorly in the pelvis, as well as fluid collection measuring 7.8 x 2.1 cm concerning for possible abscess. Given the location, these findings most likely represent perforated diverticulitis. There is seen some wall thickening of adjacent small bowel loops most consistent with secondary inflammation. Free air is noted anterior to the right hepatic lobe is well. Critical  Value/emergent results were called by telephone at the time of interpretation on 03/29/2020 at 12:06 pm to provider [facility] , who verbally acknowledged these results. 2. Dilated small bowel loop is noted in epigastric region which most likely represents focal ileus. 3. Hepatic steatosis. 4. Small fat containing periumbilical hernia. 5. Aortic atherosclerosis. Aortic Atherosclerosis (ICD10-I70.0). Electronically Signed   By: Lupita Raider M.D.   On: 03/29/2020 12:07    ____________________________________________   PROCEDURES  Procedure(s) performed (including Critical Care):  .1-3 Lead EKG Interpretation Performed by: Gilles Chiquito, MD Authorized by: Gilles Chiquito, MD     Interpretation: normal     ECG rate assessment: normal     Rhythm: sinus rhythm     Ectopy: none     Conduction: normal       ____________________________________________   INITIAL IMPRESSION / ASSESSMENT AND PLAN / ED COURSE      Patient presents with above-stated exam for assessment of left lower quadrant abdominal pain associate with vomiting and diarrhea has been getting worse over last couple of days.  On arrival patient is afebrile hemodynamically stable.   Primary differential includes but is not limited to diverticulitis, factious enteritis, kidney stone, pancreatitis, cholecystitis, cystitis and pyelonephritis.  CT obtained shows evidence of perforated diverticulitis with small possible early developing abscess.  She is also noted to have hepatic steatosis and a small fat-containing periumbilical hernia as well as some aortic atherosclerosis.  Lipase of 42 is not consistent with acute pancreatitis.  CMP shows no significant electrolyte or metabolic derangements.  No evidence of hepatitis or cholestasis.  CBC shows WBC count of 16.2 with normal hemoglobin and platelets.  Blood cultures were obtained and patient was given IV antibiotics as well as IV fluids analgesia and antiemetics noted  below.  General surgery service consulted who will admit the patient for further evaluation management.      ____________________________________________   FINAL CLINICAL IMPRESSION(S) / ED DIAGNOSES  Final diagnoses:  Diverticulitis of large intestine with perforation and abscess, unspecified bleeding status    Medications  piperacillin-tazobactam (ZOSYN) IVPB 3.375 g (has no administration in time range)  ondansetron (ZOFRAN) injection 4 mg (4 mg Intravenous Given 03/29/20 1108)  lactated ringers bolus 1,000 mL (1,000 mLs Intravenous New Bag/Given 03/29/20 1109)  HYDROmorphone (DILAUDID) injection 0.5 mg (0.5 mg Intravenous Given 03/29/20 1109)  iohexol (OMNIPAQUE) 300 MG/ML solution 100 mL (100 mLs Intravenous Contrast Given 03/29/20 1144)     ED Discharge Orders    None  Note:  This document was prepared using Dragon voice recognition software and may include unintentional dictation errors.   Gilles Chiquito, MD 03/29/20 [phone]

## 2020-03-29 NOTE — ED Triage Notes (Signed)
First Nurse Note:  Arrives with c/o abdominal pain and vomiting since Thursday.  AAOx3. NAD

## 2020-03-29 NOTE — Transfer of Care (Signed)
Immediate Anesthesia Transfer of Care Note  Patient: Monica Mckenzie  Procedure(s) Performed: COLECTOMY WITH COLOSTOMY CREATION/HARTMANN PROCEDURE (N/A )  Patient Location: PACU  Anesthesia Type:General  Level of Consciousness: drowsy  Airway & Oxygen Therapy: Patient Spontanous Breathing and Patient connected to face mask oxygen  Post-op Assessment: Report given to RN and Post -op Vital signs reviewed and stable  Post vital signs: Reviewed and stable  Last Vitals:  Vitals Value Taken Time  BP 140/77 03/29/20 1808  Temp 36.2 C 03/29/20 1808  Pulse 82 03/29/20 1813  Resp 17 03/29/20 1813  SpO2 100 % 03/29/20 1813  Vitals shown include unvalidated device data.  Last Pain:  Vitals:   03/29/20 1408  TempSrc: Oral  PainSc: 7          Complications: No complications documented.

## 2020-03-29 NOTE — H&P (Signed)
[facility] SURGICAL HISTORY & PHYSICAL (cpt [phone])  HISTORY OF PRESENT ILLNESS (HPI):  65 y.o. female presented to [facility] ED today for abdominal pain. Patient reports the acute onset of severe and sharp LLQ abdominal pain on Thursday evening. This pain radiated across her abdomen. She got no relief from this. She reports that even simple movements greatly exacerbated the pain. She endorsed associated nausea, decreased appetite with the pain. No fever, chills, cough, SOB, CP, emesis, or bowel changes. She denied any history of similar in the past. No previous history of diverticulitis. No previous abdominal surgeries. Work up in the ED revealed leukocytosis to 16.2k and CT Abdomen/Pelvis was concerning for diverticulitis with scattered pneumoperitoneum.   General surgery is consulted by emergency medicine physician Dr Antoine Primas, MD for evaluation and management of perforated appendicitis.   PAST MEDICAL HISTORY (PMH):  Past Medical History:  Diagnosis Date  . Allergy   . Asthma     Reviewed. Otherwise negative.   PAST SURGICAL HISTORY (PSH):  Past Surgical History:  Procedure Laterality Date  . COLONOSCOPY  2012   cleared for 10 yrs- Dr Bluford Kaufmann    Reviewed. Otherwise negative.   MEDICATIONS:  Prior to Admission medications   Medication Sig Start Date End Date Taking? Authorizing Provider  Calcium Carb-Cholecalciferol (CALTRATE 600+D3 SOFT PO) Take 1 tablet by mouth daily.    [provider]  Multiple Vitamins-Minerals (CENTRUM SILVER ULTRA WOMENS PO) Take 1 tablet by mouth daily.    [provider]  Phenazopyridine HCl (AZO TABS PO) Take 2 capsules by mouth daily.    [provider]     ALLERGIES:  No Known Allergies   SOCIAL HISTORY:  Social History   Socioeconomic History  . Marital status: Married    Spouse name: Not on file  . Number of children: Not on file  . Years of education: Not on file  . Highest education level: Not on  file  Occupational History  . Not on file  Tobacco Use  . Smoking status: Former Games developer  . Smokeless tobacco: Never Used  Substance and Sexual Activity  . Alcohol use: Yes    Alcohol/week: 0.0 standard drinks  . Drug use: No  . Sexual activity: Yes  Other Topics Concern  . Not on file  Social History Narrative  . Not on file   Social Determinants of Health   Financial Resource Strain: Not on file  Food Insecurity: Not on file  Transportation Needs: Not on file  Physical Activity: Not on file  Stress: Not on file  Social Connections: Not on file  Intimate Partner Violence: Not on file     FAMILY HISTORY:  Family History  Problem Relation Age of Onset  . Cancer Mother   . Breast cancer Mother 30  . Diabetes Father   . Hypertension Father     Otherwise negative.   REVIEW OF SYSTEMS:  Review of Systems  Constitutional: Negative for chills and fever.       + Decreased Appetite   HENT: Negative for congestion and sore throat.   Respiratory: Negative for cough and shortness of breath.   Cardiovascular: Negative for chest pain and palpitations.  Gastrointestinal: Positive for abdominal pain and nausea. Negative for blood in stool, constipation, diarrhea and vomiting.  All other systems reviewed and are negative.   VITAL SIGNS:  Temp:  [98.1 F (36.7 C)-99.5 F (37.5 C)] 98.1 F (36.7 C) (01/11 0925) Pulse Rate:  [75-99] 80 (01/11 1100) Resp:  [  16-20] 16 (01/11 1100) BP: (144-159)/(68-88) 149/68 (01/11 1100) SpO2:  [96 %-97 %] 96 % (01/11 1100) Weight:  [86.2 kg] 86.2 kg (01/11 0923)     Height: 5\' 1"  (154.9 cm) Weight: 86.2 kg BMI (Calculated): 35.92   PHYSICAL EXAM:  Physical Exam Vitals and nursing note reviewed.  Constitutional:      Appearance: She is well-developed. She is obese. She is ill-appearing.     Comments: Patient resting in bed, appears uncomfortable   HENT:     Head: Normocephalic and atraumatic.  Eyes:     General: No scleral icterus.     Extraocular Movements: Extraocular movements intact.  Cardiovascular:     Rate and Rhythm: Normal rate and regular rhythm.     Heart sounds: Normal heart sounds. No murmur heard.   Pulmonary:     Effort: Pulmonary effort is normal. No respiratory distress.     Breath sounds: Normal breath sounds.  Abdominal:     General: There is no distension.     Palpations: Abdomen is soft.     Tenderness: There is abdominal tenderness in the suprapubic area and left lower quadrant. There is rebound. There is no guarding.     Comments: Abdomen is obese, soft, significant tenderness to the LLQ with rebound, non-distended, no previous surgical scar  Genitourinary:    Comments: Deferred Skin:    General: Skin is warm and dry.     Coloration: Skin is not jaundiced.  Neurological:     General: No focal deficit present.     Mental Status: She is alert and oriented to person, place, and time.  Psychiatric:        Mood and Affect: Mood normal.        Behavior: Behavior normal.     INTAKE/OUTPUT:  This shift: No intake/output data recorded.  Last 2 shifts: @IOLAST2SHIFTS @  Labs:  CBC Latest Ref Rng & Units 03/29/2020 03/28/2020  WBC 4.0 - 10.5 K/uL 16.2(H) 17.9(H)  Hemoglobin 12.0 - 15.0 g/dL 15.3(H) 15.1(H)  Hematocrit 36.0 - 46.0 % 45.9 46.0  Platelets 150 - 400 K/uL 289 292   CMP Latest Ref Rng & Units 03/29/2020 03/28/2020 04/07/2015  Glucose 70 - 99 mg/dL 05/26/2020) 04/09/2015) 93  BUN 8 - 23 mg/dL 23 22 14   Creatinine 0.44 - 1.00 mg/dL 921(J 941(D  Sodium 135 - 145 mmol/L 141 140 142  Potassium 3.5 - 5.1 mmol/L 3.8 3.9 4.6  Chloride 98 - 111 mmol/L 105 102 103  CO2 22 - 32 mmol/L 23 24 24   Calcium 8.9 - 10.3 mg/dL 9.1 8.9 9.7  Total Protein 6.5 - 8.1 g/dL 7.3 7.6 -  Total Bilirubin 0.3 - 1.2 mg/dL 1.0 1.1 -  Alkaline Phos 38 - 126 U/L 60 59 -  AST 15 - 41 U/L 19 24 -  ALT 0 - 44 U/L 19 22 -    Imaging studies:   CT Abdomen/Pelvis (03/29/2020) personally reviewed with scattered  pneumoperitoneum and significant stranding surrounding the sigmoid colon concerning for perforated diverticulitis, and radiologist report reviewed:   IMPRESSION: 1. Sigmoid diverticulosis is noted. Inflammatory changes as well as free air is noted anteriorly in the pelvis, as well as fluid collection measuring 7.8 x 2.1 cm concerning for possible abscess. Given the location, these findings most likely represent perforated diverticulitis. There is seen some wall thickening of adjacent small bowel loops most consistent with secondary inflammation. Free air is noted anterior to the right hepatic lobe is well. Critical Value/emergent results  were called by telephone at the time of interpretation on 03/29/2020 at 12:06 pm to provider [facility] , who verbally acknowledged these results. 2. Dilated small bowel loop is noted in epigastric region which most likely represents focal ileus. 3. Hepatic steatosis. 4. Small fat containing periumbilical hernia. 5. Aortic atherosclerosis.   Assessment/Plan: (ICD-10's: K33.20) 65 y.o. female with leukocytosis and significant LLQ abdominal pain concerning for perforated diverticulitis with pneumoperitoneum and peritonitis.    - Given her leukocytosis, pneumoperitoneum on imaging, and concerning physical examination findings, I do feel that it is in her best interest to proceed emergently with Hartman's Procedure. She, and her husband at bedside, are understanding of this and understand this will result in temporizing colostomy. We will plan to proceed emergently pending OR availability  - All risks, benefits, and alternatives to above procedure(s) were discussed with the patient and her husband, all of their questions were answered to their expressed satisfaction, patient expresses she wishes to proceed, and informed consent was obtained.  - NPO + IVF Resuscitation  - IV ABx (Zosyn)  - Monitor abdominal examination  - Pain control prn; antiemetics prn  -  Morning labs; CBC, CMP   - DVT prophylaxis; Hold for OR  All of the above findings and recommendations were discussed with the patient and her family, and all of their questions were answered to their expressed satisfaction.  -- Lynden Oxford, PA-C  [facility] 03/29/2020, 12:33 PM [phone] M-F: 7am - 4pm

## 2020-03-29 NOTE — Anesthesia Procedure Notes (Signed)
Procedure Name: Intubation Date/Time: 03/29/2020 2:55 PM Performed by: Joanette Gula, Lemarcus Baggerly, CRNA Pre-anesthesia Checklist: Patient identified, Emergency Drugs available, Suction available and Patient being monitored Patient Re-evaluated:Patient Re-evaluated prior to induction Oxygen Delivery Method: Circle system utilized Preoxygenation: Pre-oxygenation with 100% oxygen Induction Type: IV induction, Rapid sequence and Cricoid Pressure applied Laryngoscope Size: McGraph and 3 Grade View: Grade I Tube type: Oral Number of attempts: 1 Airway Equipment and Method: Stylet Placement Confirmation: ETT inserted through vocal cords under direct vision,  positive ETCO2 and breath sounds checked- equal and bilateral Secured at: 20 cm Tube secured with: Tape Dental Injury: Teeth and Oropharynx as per pre-operative assessment

## 2020-03-29 NOTE — ED Notes (Signed)
Surgery at bedside.

## 2020-03-29 NOTE — ED Triage Notes (Signed)
Pt comes via POV from home with c/o abdominal pain, N/V. Pt states this started last Thursday and hasn't gotten any better.  Pt states her PCP wants her to have a xray and Ct scan

## 2020-03-29 NOTE — Anesthesia Preprocedure Evaluation (Signed)
Anesthesia Evaluation  Patient identified by MRN, date of birth, ID band Patient awake    Reviewed: Allergy & Precautions, H&P , NPO status , Patient's Chart, lab work & pertinent test results, reviewed documented beta blocker date and time   History of Anesthesia Complications Negative for: history of anesthetic complications  Airway Mallampati: III  TM Distance: >3 FB Neck ROM: full    Dental  (+) Dental Advidsory Given, Teeth Intact, Implants, Caps   Pulmonary neg shortness of breath, asthma , neg sleep apnea, neg COPD, neg recent URI, former smoker,    Pulmonary exam normal breath sounds clear to auscultation       Cardiovascular Exercise Tolerance: Good negative cardio ROS Normal cardiovascular exam Rhythm:regular Rate:Normal     Neuro/Psych negative neurological ROS  negative psych ROS   GI/Hepatic negative GI ROS, Neg liver ROS,   Endo/Other  negative endocrine ROS  Renal/GU negative Renal ROS  negative genitourinary   Musculoskeletal   Abdominal   Peds  Hematology negative hematology ROS (+)   Anesthesia Other Findings Past Medical History: No date: Allergy No date: Asthma   Reproductive/Obstetrics negative OB ROS                             Anesthesia Physical Anesthesia Plan  ASA: II  Anesthesia Plan: General   Post-op Pain Management:    Induction: Intravenous, Rapid sequence and Cricoid pressure planned  PONV Risk Score and Plan: 3 and Ondansetron, Dexamethasone, Midazolam, Promethazine and Treatment may vary due to age or medical condition  Airway Management Planned: Oral ETT  Additional Equipment:   Intra-op Plan:   Post-operative Plan: Extubation in OR  Informed Consent: I have reviewed the patients History and Physical, chart, labs and discussed the procedure including the risks, benefits and alternatives for the proposed anesthesia with the patient or  authorized representative who has indicated his/her understanding and acceptance.     Dental Advisory Given  Plan Discussed with: Anesthesiologist, CRNA and Surgeon  Anesthesia Plan Comments:         Anesthesia Quick Evaluation

## 2020-03-30 ENCOUNTER — Encounter: Payer: Self-pay | Admitting: Surgery

## 2020-03-30 LAB — COMPREHENSIVE METABOLIC PANEL
ALT: 17 U/L (ref 0–44)
AST: 19 U/L (ref 15–41)
Albumin: 1.9 g/dL — ABNORMAL LOW (ref 3.5–5.0)
Alkaline Phosphatase: 40 U/L (ref 38–126)
Anion gap: 10 (ref 5–15)
BUN: 20 mg/dL (ref 8–23)
CO2: 23 mmol/L (ref 22–32)
Calcium: 7.3 mg/dL — ABNORMAL LOW (ref 8.9–10.3)
Chloride: 112 mmol/L — ABNORMAL HIGH (ref 98–111)
Creatinine, Ser: 0.79 mg/dL (ref 0.44–1.00)
GFR, Estimated: >60 mL/min (ref >60)
Glucose, Bld: 153 mg/dL — ABNORMAL HIGH (ref 70–99)
Potassium: 4.1 mmol/L (ref 3.5–5.1)
Sodium: 145 mmol/L (ref 135–145)
Total Bilirubin: 1 mg/dL (ref 0.3–1.2)
Total Protein: 4.8 g/dL — ABNORMAL LOW (ref 6.5–8.1)

## 2020-03-30 LAB — CBC
HCT: 40.5 % (ref 36.0–46.0)
Hemoglobin: 13.3 g/dL (ref 12.0–15.0)
MCH: 29.8 pg (ref 26.0–34.0)
MCHC: 32.8 g/dL (ref 30.0–36.0)
MCV: 90.8 fL (ref 80.0–100.0)
Platelets: 275 10*3/uL (ref 150–400)
RBC: 4.46 MIL/uL (ref 3.87–5.11)
RDW: 13.7 % (ref 11.5–15.5)
WBC: 16.3 10*3/uL — ABNORMAL HIGH (ref 4.0–10.5)
nRBC: 0 % (ref 0.0–0.2)

## 2020-03-30 LAB — PHOSPHORUS: Phosphorus: 3.6 mg/dL (ref 2.5–4.6)

## 2020-03-30 LAB — MAGNESIUM: Magnesium: 2.1 mg/dL (ref 1.7–2.4)

## 2020-03-30 MED ORDER — ACETAMINOPHEN 10 MG/ML IV SOLN
INTRAVENOUS | Status: AC
  Administered 2020-03-30: 1000 mg via INTRAVENOUS
  Filled 2020-03-30: qty 100

## 2020-03-30 MED ORDER — PIPERACILLIN-TAZOBACTAM 3.375 G IVPB
INTRAVENOUS | Status: AC
  Administered 2020-03-30: 3.375 g via INTRAVENOUS
  Filled 2020-03-30: qty 50

## 2020-03-30 MED ORDER — PHENOL 1.4 % MT LIQD
1.0000 | OROMUCOSAL | Status: DC | PRN
  Filled 2020-03-30 (×2): qty 177

## 2020-03-30 MED ORDER — ALBUMIN HUMAN 25 % IV SOLN: 25.0000 g | Freq: Once | INTRAVENOUS | Status: AC

## 2020-03-30 MED ORDER — ALBUMIN HUMAN 5 % IV SOLN: 25.0000 g | Freq: Once | INTRAVENOUS | Status: DC

## 2020-03-30 MED ORDER — ALBUMIN HUMAN 5 % IV SOLN
INTRAVENOUS | Status: AC
  Filled 2020-03-30: qty 250

## 2020-03-30 MED ORDER — HYDROMORPHONE HCL 1 MG/ML IJ SOLN
INTRAMUSCULAR | Status: AC
  Administered 2020-03-30: 1 mg via INTRAVENOUS
  Filled 2020-03-30: qty 1

## 2020-03-30 MED ORDER — ALBUMIN HUMAN 25 % IV SOLN
INTRAVENOUS | Status: AC
  Administered 2020-03-30: 25 g via INTRAVENOUS
  Filled 2020-03-30: qty 100

## 2020-03-30 NOTE — Consult Note (Signed)
WOC Nurse ostomy consult note Consult received for new ostomy created emergently in evening of 03/29/20 for ruptured diverticulum.  Will see to begin ostomy teaching on Thursday, 03/31/20 and plan for first pouch change on Friday, 04/01/20.  It is noted that NPWT is being considered for the midline wound by surgery team and is tentatively planned to begin on 04/01/20. I will await their direction for initiation of NPWT on Friday and perform this with initial pouch change over stoma Friday am.  WOC nursing team will follow, and will remain available to this patient, the nursing, surgical and medical teams.    Thank you for this consultation. Ladona Mow, MSN, RN, GNP, [facility]  Pager# [phone]

## 2020-03-30 NOTE — Evaluation (Signed)
Physical Therapy Evaluation Patient Details Name: Monica Mckenzie MRN: [id number] DOB: [birth year] Today's Date: 03/30/2020   History of Present Illness  Pt is a 65 yo female s/p ostomy 03/29/2020 due to ruptured diverticulum. Pt also developed ileus, NGT placed. PMH of asthma.    Clinical Impression  Pt alert, agreeable to PT. Reported that she is independent at baseline, lives with her husband who would be able to assist 24/7 if needed.  The patient was instructed in log rolling technique for bed mobility, min-modA to accomplish, limited more by pain than weakness. Sit <> stand from EOB and standard commode during session with RW and CGA, cued for hand placement to maximize safety. She ambulated ~39ft with CGA and chair follow. Decreased gait velocity noted, as well as flexed trunk due to abdominal pain. Returned to supine with all needs at reach, and family at bedside. Pt educated on mobilization and HEP, verbalized understanding.  Overall the patient demonstrated deficits (see "PT Problem List") that impede the patient's functional abilities, safety, and mobility and would benefit from skilled PT intervention. Recommendation is HHPT with 24/7 supervision/assistance.     Follow Up Recommendations Home health PT;Supervision/Assistance - 24 hour    Equipment Recommendations  Rolling walker with 5" wheels    Recommendations for Other Services       Precautions / Restrictions Precautions Precautions: Fall Precaution Comments: colostomy, JP drain, abdominal wound with dressing Restrictions Weight Bearing Restrictions: No      Mobility  Bed Mobility Overal bed mobility: Needs Assistance Bed Mobility: Rolling;Sidelying to Sit;Sit to Sidelying Rolling: Min assist Sidelying to sit: Mod assist     Sit to sidelying: Mod assist General bed mobility comments: modA for trunk elevation; LE assist returning to supine and controlled rolling in bed    Transfers Overall transfer level: Needs  assistance Equipment used: Rolling walker (2 wheeled) Transfers: Sit to/from Stand Sit to Stand: Min guard         General transfer comment: cues for hand placement to maximize safety  Ambulation/Gait Ambulation/Gait assistance: Min guard Gait Distance (Feet): 70 Feet Assistive device: Rolling walker (2 wheeled) Gait Pattern/deviations: Step-through pattern;Antalgic;Trunk flexed Gait velocity: decreased      Stairs            Wheelchair Mobility    Modified Rankin (Stroke Patients Only)       Balance Overall balance assessment: Needs assistance Sitting-balance support: Feet supported Sitting balance-Leahy Scale: Good       Standing balance-Leahy Scale: Fair                               Pertinent Vitals/Pain Pain Assessment: Faces Faces Pain Scale: Hurts whole lot Pain Location: abdomen Pain Descriptors / Indicators: Guarding;Grimacing;Moaning Pain Intervention(s): Limited activity within patient's tolerance;Monitored during session;Repositioned;Premedicated before session    Home Living Family/patient expects to be discharged to:: Private residence Living Arrangements: Spouse/significant other Available Help at Discharge: Family Type of Home: House Home Access: Level entry     Home Layout: One level Home Equipment: Information systems manager - built in;Hand held shower head;Grab bars - tub/shower      Prior Function Level of Independence: Independent         Comments: drives, cooks, Lexicographer, retired Engineer, site, 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     Higher education careers adviser Dominance   Dominant Hand: Right    Extremity/Trunk Assessment   Upper Extremity Assessment Upper Extremity Assessment: Overall WFL for tasks assessed  Lower Extremity Assessment Lower Extremity Assessment: Overall WFL for tasks assessed (limited due to pain)    Cervical / Trunk Assessment Cervical / Trunk Assessment: Normal  Communication   Communication: No difficulties   Cognition Arousal/Alertness: Awake/alert Behavior During Therapy: WFL for tasks assessed/performed Overall Cognitive Status: Within Functional Limits for tasks assessed                                        General Comments      Exercises Other Exercises Other Exercises: Patient utilized standard commode with CGA and use of grab bar, able to perform pericare with supervision   Assessment/Plan    PT Assessment Patient needs continued PT services  PT Problem List Decreased mobility;Decreased balance;Pain;Decreased activity tolerance;Decreased knowledge of use of DME;Decreased knowledge of precautions;Decreased strength       PT Treatment Interventions DME instruction;Therapeutic exercise;Gait training;Balance training;Stair training;Neuromuscular re-education;Functional mobility training;Therapeutic activities;Patient/family education    PT Goals (Current goals can be found in the Care Plan section)  Acute Rehab PT Goals Patient Stated Goal: to go home PT Goal Formulation: With patient Time For Goal Achievement: 04/13/20 Potential to Achieve Goals: Good    Frequency Min 2X/week   Barriers to discharge        Co-evaluation               AM-PAC PT "6 Clicks" Mobility  Outcome Measure Help needed turning from your back to your side while in a flat bed without using bedrails?: A Little Help needed moving from lying on your back to sitting on the side of a flat bed without using bedrails?: A Little Help needed moving to and from a bed to a chair (including a wheelchair)?: A Little Help needed standing up from a chair using your arms (e.g., wheelchair or bedside chair)?: A Little Help needed to walk in hospital room?: A Little Help needed climbing 3-5 steps with a railing? : A Little 6 Click Score: 18    End of Session Equipment Utilized During Treatment: Gait belt (above belly) Activity Tolerance: Patient tolerated treatment well Patient left: in  bed;with call bell/phone within reach;with nursing/sitter in room;with family/visitor present Nurse Communication: Mobility status PT Visit Diagnosis: Muscle weakness (generalized) (M62.81);Other abnormalities of gait and mobility (R26.89);Difficulty in walking, not elsewhere classified (R26.2);Pain Pain - Right/Left:  (midline) Pain - part of body:  (abdomen)    Time: 3810-1751 PT Time Calculation (min) (ACUTE ONLY): 38 min   Charges:   PT Evaluation $PT Eval Low Complexity: 1 Low PT Treatments $Therapeutic Exercise: 23-37 mins        Olga Coaster PT, DPT 3:59 PM,03/30/20

## 2020-03-30 NOTE — Progress Notes (Signed)
[facility] SURGICAL PROGRESS NOTE  Hospital Day(s): 1.   Post op day(s): 1 Day Post-Op.   Interval History:  Patient seen and examined No acute events or new complaints overnight.  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; dusky appearance as expected  Has not mobilized yet  Vital signs in last 24 hours: [min-max] current  Temp:  [97.2 F (36.2 C)-98.5 F (36.9 C)] 97.9 F (36.6 C) (01/11 2133) Pulse Rate:  [75-106] 93 (01/12 0433) Resp:  [16-22] 18 (01/12 0433) BP: (97-149)/(61-83) 105/67 (01/12 0433) SpO2:  [95 %-100 %] 96 % (01/12 0433) Weight:  [86.2 kg] 86.2 kg (01/11 0923)     Height: 5\' 1"  (154.9 cm) Weight: 86.2 kg BMI (Calculated): 35.92   Intake/Output last 2 shifts:  01/11 0701 - 01/12 0700 In: 2350 [I.V.:2300; IV Piggyback:50] Out: 725 [Urine:190; Emesis/NG output:30; Drains:105; Blood:150]   Physical Exam:  Constitutional: alert, cooperative and no distress  HEENT: NGT in place with bilious output Respiratory: breathing non-labored at rest  Cardiovascular: regular rate and sinus rhythm  Gastrointestinal: soft, incisional soreness, non-distended, no rebound/guarding. Surgical drain in RLQ with serosanguinous output, colostomy in left mid-abdomen, dusky appearance as expected, minimal bowel sweat present in bag, no gas Integumentary: Midline wound healing via secondary intention, no drainage or surrounding erythema  Labs:  CBC Latest Ref Rng & Units 03/29/2020 03/28/2020  WBC 4.0 - 10.5 K/uL 16.2(H) 17.9(H)  Hemoglobin 12.0 - 15.0 g/dL 15.3(H) 15.1(H)  Hematocrit 36.0 - 46.0 % 45.9  46.0  Platelets 150 - 400 K/uL 289 292   CMP Latest Ref Rng & Units 03/30/2020 03/29/2020 03/28/2020  Glucose 70 - 99 mg/dL 05/26/2020) 638(V) 564(P)  BUN 8 - 23 mg/dL 20 23 22   Creatinine 0.44 - 1.00 mg/dL 329(J 1.88  Sodium 135 - 145 mmol/L 145 141 140  Potassium 3.5 - 5.1 mmol/L 4.1 3.8 3.9  Chloride 98 - 111 mmol/L 112(H) 105 102  CO2 22 - 32 mmol/L 23 23 24   Calcium 8.9 - 10.3 mg/dL 7.3(L) 9.1 8.9  Total Protein 6.5 - 8.1 g/dL 4.8(L) 7.3 7.6  Total Bilirubin 0.3 - 1.2 mg/dL 1.0 1.0 1.1  Alkaline Phos 38 - 126 U/L 40 60 59  AST 15 - 41 U/L 19 19 24   ALT 0 - 44 U/L 17 19 22      Imaging studies: No new pertinent imaging studies   Assessment/Plan:  65 y.o. female overall doing well 1 Day Post-Op s/p Hartman's Procedure for perforated diverticulitis.   - Remain NPO for now + NGT decompression to LIS; monitor and record output  - If can not advance diet in ~48 hours; may need to consider TPN  - Wound Care: change midline wound dressing BID with Kerlix + ABD pads, will tentatively plan to transition to wound vac on Friday 1/14  - Engage WOC RN for ostomy teaching  - Continue IVF resuscitation + albumin  - Continue IV Abx (Zosyn)  - Monitor abdominal examination; on-going colostomy function   - Pain control prn; antiemetics prn  - Discontinue foley catheter  - Morning labs: CBC, BMP  - Mobilization encouraged; will engage PT  All of the above findings and recommendations were discussed with the patient, and the medical team, and all of patient's questions were answered to her expressed satisfaction.  -- Lynden Oxford, PA-C  [facility] 03/30/2020, 7:44 AM [phone] M-F: 7am - 4pm

## 2020-03-31 ENCOUNTER — Inpatient Hospital Stay: Payer: Medicare HMO

## 2020-03-31 LAB — CBC
HCT: 33.2 % — ABNORMAL LOW (ref 36.0–46.0)
Hemoglobin: 11 g/dL — ABNORMAL LOW (ref 12.0–15.0)
MCH: 30.4 pg (ref 26.0–34.0)
MCHC: 33.1 g/dL (ref 30.0–36.0)
MCV: 91.7 fL (ref 80.0–100.0)
Platelets: 211 10*3/uL (ref 150–400)
RBC: 3.62 MIL/uL — ABNORMAL LOW (ref 3.87–5.11)
RDW: 14.1 % (ref 11.5–15.5)
WBC: 14.1 10*3/uL — ABNORMAL HIGH (ref 4.0–10.5)
nRBC: 0 % (ref 0.0–0.2)

## 2020-03-31 LAB — BASIC METABOLIC PANEL
Anion gap: 8 (ref 5–15)
BUN: 18 mg/dL (ref 8–23)
CO2: 27 mmol/L (ref 22–32)
Calcium: 8 mg/dL — ABNORMAL LOW (ref 8.9–10.3)
Chloride: 112 mmol/L — ABNORMAL HIGH (ref 98–111)
Creatinine, Ser: 0.78 mg/dL (ref 0.44–1.00)
GFR, Estimated: >60 mL/min (ref >60)
Glucose, Bld: 107 mg/dL — ABNORMAL HIGH (ref 70–99)
Potassium: 4 mmol/L (ref 3.5–5.1)
Sodium: 147 mmol/L — ABNORMAL HIGH (ref 135–145)

## 2020-03-31 LAB — SURGICAL PATHOLOGY

## 2020-03-31 MED ORDER — KETOROLAC TROMETHAMINE 15 MG/ML IJ SOLN
INTRAMUSCULAR | Status: AC
  Filled 2020-03-31: qty 1

## 2020-03-31 MED ORDER — PIPERACILLIN-TAZOBACTAM 3.375 G IVPB
INTRAVENOUS | Status: AC
  Administered 2020-03-31: 3.375 g via INTRAVENOUS
  Filled 2020-03-31: qty 50

## 2020-03-31 MED ORDER — BOOST / RESOURCE BREEZE PO LIQD CUSTOM
1.0000 | Freq: Three times a day (TID) | ORAL | Status: DC
  Administered 2020-03-31 – 2020-04-05 (×11): 1 via ORAL

## 2020-03-31 MED ORDER — ENOXAPARIN SODIUM 40 MG/0.4ML ~~LOC~~ SOLN
40.0000 mg | SUBCUTANEOUS | Status: DC
  Filled 2020-03-31: qty 0.4

## 2020-03-31 MED ORDER — ENOXAPARIN SODIUM 40 MG/0.4ML ~~LOC~~ SOLN
SUBCUTANEOUS | Status: AC
  Administered 2020-03-31: 40 mg via SUBCUTANEOUS
  Filled 2020-03-31: qty 0.4

## 2020-03-31 NOTE — Consult Note (Signed)
WOC Nurse ostomy consult note Stoma type/location: LLQ colostomy. Dr. Everlene Farrier at bedside for portion of visit today. Continue with plan for VAC placement in am. Initiation of supply orders for that medical device performed. Supplies for ostomy are ordered.  Patient with NGT and is hopeful that this will be removed today. Small amount of flatus noted overnight. Stomal assessment/size: Approximately 1 and 1/2 inches round, flush, dusky, lumen in center Peristomal assessment: not seen today Treatment options for stomal/peristomal skin: N/A Output: scant serosanguinous   Ostomy pouching: 2pc. 2 and 1/4 inch pouching system applied intraoperatively. Intact. Planned pouch change in am with placement of NPWT.  Education provided:  Extended session for ostomy teaching with patient and spouse.  They know of no one who has had an ostomy and have many questions. Patient wishes to be independent with spouse for stand-by assist as needed. An educational booklet and folder with resources is provided. A one-page teaching guide for pouch change is reviewed.  Explained role of ostomy nurse and creation of stoma  Explained stoma characteristics (budded, flush, color, texture, care) Demonstrated pouch change (cutting new skin barrier, measuring stoma, cleaning peristomal skin and stoma, use of barrier ring) and placed pouch on this writer Education on emptying when 1/3 to 1/2 full and how to empty. Patient is able to provide a return demonstration of Lock and Roll closure and reiterate steps for emptying pouch x2. Demonstrated use of wick to clean spout  Discussed bathing, diet, gas, medication use, constipation Discussed risk of peristomal hernia and instructed patient to refrain from lifting anything over 15# unless otherwise directed by Dr. Everlene Farrier.  Answered patient/family questions regarding bathing, singing with her choir and other activities such as childcare for her grandchildren:    Supplies ordered to  bedside for pouch change and initiation of NPWT tomorrow.  NOTE:  If patient is to have NPWT continue at home she will require the services of HHRN 3 times/week. She will need orders for NS dressing to the midline wound for discharge with initiation of NPWT using the HHA's equipment at first visit. If you are in agreement, please order/arrange.  Enrolled patient in [employer] DC program: Yes. 4 skin barriers, 4 opaque pouches with integrated gas filter, 4 closed in pouches, stoma powder and skin barrier rings requested.  Next visit is tomorrow, 04/01/20.  WOC nursing team will follow, and will remain available to this patient, the nursing, surgical and medical teams.  Thanks, Ladona Mow, MSN, RN, GNP, [facility]  Pager# [phone]

## 2020-03-31 NOTE — Progress Notes (Signed)
PT Cancellation Note  Patient Details Name: Monica Mckenzie MRN: [id number] DOB: 12/06/1955   Cancelled Treatment:    Reason Eval/Treat Not Completed: Other (comment). Pt in chair, family at bedside. Reported she has had a very busy morning, and has been up and moving at least 3 times this AM. Politely declined PT to allow her to tap a nap and rest. PT to re-attempt as able.    Olga Coaster PT, DPT 10:47 AM,03/31/20

## 2020-03-31 NOTE — Progress Notes (Signed)
[facility] SUR[facility]ICAL PROGRESS NOTE  Hospital Day(s): 2.   Post op day(s): 2 Days Post-Op.   Interval History:  Patient seen and examined No acute events or new complaints overnight.  Patient reports she is feeling better this morning She has abdominal soreness, worse with ambulation, but pain regimen is working Complains of post-nasal drip and discomfort associated with NGT No chills, nausea, emesis Leukocytosis has improved to 14.1K; no fevers Renal function remains normal with sCr - 0.78; UO 900 ccs + unmeasured NGT output recorded at 550 ccs but she does endorse that she "stopped counting" the number of ice chips she had yesterday Surgical drain with 210 ccs; serosanguinous She has passed gas through colostomy; still dusky as expected Worked with PT yesterday; recommending HHPT  Vital signs in last 24 hours: [min-max] current  Temp:  [96.8 F (36 C)-98.1 F (36.7 C)] 96.8 F (36 C) (01/13 0400) Pulse Rate:  [73-91] 77 (01/13 0400) Resp:  [18] 18 (01/13 0400) BP: (106-125)/(49-69) 106/69 (01/13 0400) SpO2:  [94 %-99 %] 99 % (01/13 0400)     Height: 5\' 1"  (154.9 cm) Weight: 86.2 kg BMI (Calculated): 35.92   Intake/Output last 2 shifts:  01/12 0701 - 01/13 0700 In: 1890.9 [I.V.:1336.2; IV Piggyback:554.7] Out: 1660 [Urine:900; Emesis/NG output:550; Drains:210]   Physical Exam:  Constitutional: alert, cooperative and no distress  HEENT: NGT in place with bilious output; more diluted this morning  Respiratory: breathing non-labored at rest  Cardiovascular: regular rate and sinus rhythm  Gastrointestinal: soft, incisional soreness, non-distended, no rebound/guarding. Surgical drain in RLQ with serosanguinous output, colostomy in left mid-abdomen, dusky appearance as expected, minimal bowel sweat present in bag, now with gas in bag Integumentary: Midline wound healing via secondary intention, no drainage or surrounding erythema   Labs:  CBC Latest Ref Rng &  Units 03/31/2020 03/30/2020 03/29/2020  WBC 4.0 - 10.5 K/uL 14.1(H) 16.3(H) 16.2(H)  Hemoglobin 12.0 - 15.0 g/dL 11.0(L) 13.3 15.3(H)  Hematocrit 36.0 - 46.0 % 33.2(L) 40.5 45.9  Platelets 150 - 400 K/uL 211 275 289   CMP Latest Ref Rng & Units 03/30/2020 03/29/2020 03/28/2020  Glucose 70 - 99 mg/dL 05/26/2020) 376(E) 831(D)  BUN 8 - 23 mg/dL 20 23 22   Creatinine 0.44 - 1.00 mg/dL 176(H 6.07  Sodium 135 - 145 mmol/L 145 141 140  Potassium 3.5 - 5.1 mmol/L 4.1 3.8 3.9  Chloride 98 - 111 mmol/L 112(H) 105 102  CO2 22 - 32 mmol/L 23 23 24   Calcium 8.9 - 10.3 mg/dL 7.3(L) 9.1 8.9  Total Protein 6.5 - 8.1 g/dL 4.8(L) 7.3 7.6  Total Bilirubin 0.3 - 1.2 mg/dL 1.0 1.0 1.1  Alkaline Phos 38 - 126 U/L 40 60 59  AST 15 - 41 U/L 19 19 24   ALT 0 - 44 U/L 17 19 22      Imaging studies:   KUB (03/31/2020) personally reviewed with one loop of small bowel dilation centrally, no gastric distension, and gas throughout visible colon, and radiologist report reviewed:  IMPRESSION: Nasogastric tube tip seen in proximal stomach. Dilated small bowel loop is noted centrally within the abdomen which may represent postoperative ileus, but follow-up radiographs are recommended to rule out obstruction.   Assessment/Plan: 65 y.o. female with return of bowel function otherwise doing well 2 Days Post-Op s/p Hartman's Procedure for perforated diverticulitis.   - NGT clamped at 0830. Check residuals at 1230, and if <150 ccs we will remove this  - Remain NPO for now              -  If can not advance diet in ~24 hours; may need to consider TPN   - Continue IVF resuscitation; wean             - Continue IV Abx (Zosyn)   - Monitor abdominal examination; on-going colostomy function              - Pain control prn; antiemetics prn   - Wound Care: change midline wound dressing BID with Kerlix + ABD pads, will tentatively plan to transition to wound vac on Friday 1/14  - Morning labs: CBC, BMP             - Mobilization  encouraged; PT following; recommending HHPT  All of the above findings and recommendations were discussed with the patient, and the medical team, and all of patient's questions were answered to her expressed satisfaction.  -- Lynden Oxford, PA-C [facility] 03/31/2020, 7:32 AM [phone] M-F: 7am - 4pm

## 2020-04-01 LAB — CBC
HCT: 31.2 % — ABNORMAL LOW (ref 36.0–46.0)
Hemoglobin: 10.2 g/dL — ABNORMAL LOW (ref 12.0–15.0)
MCH: 29.8 pg (ref 26.0–34.0)
MCHC: 32.7 g/dL (ref 30.0–36.0)
MCV: 91.2 fL (ref 80.0–100.0)
Platelets: 213 10*3/uL (ref 150–400)
RBC: 3.42 MIL/uL — ABNORMAL LOW (ref 3.87–5.11)
RDW: 14 % (ref 11.5–15.5)
WBC: 11.9 10*3/uL — ABNORMAL HIGH (ref 4.0–10.5)
nRBC: 0 % (ref 0.0–0.2)

## 2020-04-01 LAB — BASIC METABOLIC PANEL
Anion gap: 7 (ref 5–15)
BUN: 15 mg/dL (ref 8–23)
CO2: 26 mmol/L (ref 22–32)
Calcium: 7.6 mg/dL — ABNORMAL LOW (ref 8.9–10.3)
Chloride: 112 mmol/L — ABNORMAL HIGH (ref 98–111)
Creatinine, Ser: 0.56 mg/dL (ref 0.44–1.00)
GFR, Estimated: >60 mL/min (ref >60)
Glucose, Bld: 108 mg/dL — ABNORMAL HIGH (ref 70–99)
Potassium: 3.6 mmol/L (ref 3.5–5.1)
Sodium: 145 mmol/L (ref 135–145)

## 2020-04-01 LAB — PHOSPHORUS: Phosphorus: 2 mg/dL — ABNORMAL LOW (ref 2.5–4.6)

## 2020-04-01 LAB — MAGNESIUM: Magnesium: 2.2 mg/dL (ref 1.7–2.4)

## 2020-04-01 MED ORDER — ENSURE ENLIVE PO LIQD
237.0000 mL | Freq: Two times a day (BID) | ORAL | Status: DC
  Administered 2020-04-04 – 2020-04-05 (×3): 237 mL via ORAL

## 2020-04-01 MED ORDER — KETOROLAC TROMETHAMINE 15 MG/ML IJ SOLN
INTRAMUSCULAR | Status: AC
  Administered 2020-04-01: 15 mg via INTRAVENOUS
  Filled 2020-04-01: qty 1

## 2020-04-01 MED ORDER — PIPERACILLIN-TAZOBACTAM 3.375 G IVPB
INTRAVENOUS | Status: AC
  Administered 2020-04-01: 3.375 g via INTRAVENOUS
  Filled 2020-04-01: qty 50

## 2020-04-01 MED ORDER — ACETAMINOPHEN 500 MG PO TABS
1000.0000 mg | ORAL_TABLET | Freq: Four times a day (QID) | ORAL | Status: DC
  Administered 2020-04-01 – 2020-04-04 (×6): 1000 mg via ORAL

## 2020-04-01 MED ORDER — OXYCODONE HCL 5 MG PO TABS
5.0000 mg | ORAL_TABLET | ORAL | Status: DC | PRN
  Administered 2020-04-01: 5 mg via ORAL

## 2020-04-01 MED ORDER — ENOXAPARIN SODIUM 40 MG/0.4ML ~~LOC~~ SOLN
SUBCUTANEOUS | Status: AC
  Administered 2020-04-01: 40 mg via SUBCUTANEOUS
  Filled 2020-04-01: qty 0.4

## 2020-04-01 MED ORDER — OXYCODONE HCL 5 MG PO TABS
ORAL_TABLET | ORAL | Status: AC
  Filled 2020-04-01: qty 1

## 2020-04-01 MED ORDER — ACETAMINOPHEN 500 MG PO TABS
ORAL_TABLET | ORAL | Status: AC
  Administered 2020-04-01: 1000 mg via ORAL
  Filled 2020-04-01: qty 2

## 2020-04-01 MED ORDER — POTASSIUM PHOSPHATES 15 MMOLE/5ML IV SOLN
20.0000 mmol | Freq: Once | INTRAVENOUS | Status: AC
  Administered 2020-04-01: 20 mmol via INTRAVENOUS
  Filled 2020-04-01: qty 6.67

## 2020-04-01 MED ORDER — ACETAMINOPHEN 500 MG PO TABS
ORAL_TABLET | ORAL | Status: AC
  Filled 2020-04-01: qty 2

## 2020-04-01 NOTE — Plan of Care (Signed)

## 2020-04-01 NOTE — Progress Notes (Signed)
[facility] SUR[facility]ICAL PROGRESS NOTE  Hospital Day(s): 3.   Post op day(s): 3 Days Post-Op.   Interval History:  Patient seen and examined No acute events or new complaints overnight.  Patient reports she is doing well, some abdominal soreness Leukocytosis continues to improve, down to 11.9K Renal function remains normal, sCr - 0.56, good UO Mild hypophosphatemia to 2.0 but o/w without significant electrolyte derangements NGT removed yesterday after passing clamping trial Surgical drain with only 30 ccs out; serosanguinous Colostomy with 75 ccs out recorded; + Flatus She has tolerated CLD without issues Plan to place wound vac to midline wound today with WOC RN Mobilized with PT; recommending HHPT  Surgical pathology reviewed; Perforated diverticulitis with abscess, negative for malignancy   Vital signs in last 24 hours: [min-max] current  Temp:  [96.8 F (36 C)-99.1 F (37.3 C)] 96.8 F (36 C) (01/14 0400) Pulse Rate:  [76-85] 79 (01/14 0400) Resp:  [18] 18 (01/14 0400) BP: (107-150)/(61-78) 107/78 (01/14 0400) SpO2:  [94 %-100 %] 94 % (01/14 0400)     Height: 5\' 1"  (154.9 cm) Weight: 86.2 kg BMI (Calculated): 35.92   Intake/Output last 2 shifts:  01/13 0701 - 01/14 0700 In: 902.9 [P.O.:240; I.V.:507.9; IV Piggyback:150] Out: 155 [Emesis/NG output:50; Drains:30; Stool:75]   Physical Exam:  Constitutional: alert, cooperative and no distress Respiratory: breathing non-labored at rest  Cardiovascular: regular rate and sinus rhythm  Gastrointestinal: soft,incisional soreness, non-distended, no rebound/guarding. Surgical drain in RLQ with serosanguinous output, colostomy in left mid-abdomen, dusky appearance as expected, minimal bowel sweat present in bag, now with gas in bag Integumentary:Midline wound healing via secondary intention, no drainage or surrounding erythema  Labs:  CBC Latest Ref Rng & Units 04/01/2020 03/31/2020 03/30/2020  WBC 4.0 - Monica.5 K/uL  11.9(H) 14.1(H) 16.3(H)  Hemoglobin 12.0 - 15.0 g/dL Monica.2(L) 11.0(L) 13.3  Hematocrit 36.0 - 46.0 % 31.2(L) 33.2(L) 40.5  Platelets 150 - 400 K/uL 213 211 275   CMP Latest Ref Rng & Units 04/01/2020 03/31/2020 03/30/2020  Glucose 70 - 99 mg/dL 05/28/2020) 468(E) 321(Y)  BUN 8 - 23 mg/dL 15 18 20   Creatinine 0.44 - 1.00 mg/dL 248(G 5.00  Sodium 135 - 145 mmol/L 145 147(H) 145  Potassium 3.5 - 5.1 mmol/L 3.6 4.0 4.1  Chloride 98 - 111 mmol/L 112(H) 112(H) 112(H)  CO2 22 - 32 mmol/L 26 27 23   Calcium 8.9 - Monica.3 mg/dL 7.6(L) 8.0(L) 7.3(L)  Total Protein 6.5 - 8.1 g/dL - - 4.8(L)  Total Bilirubin 0.3 - 1.2 mg/dL - - 1.0  Alkaline Phos 38 - 126 U/L - - 40  AST 15 - 41 U/L - - 19  ALT 0 - 44 U/L - - 17     Imaging studies: No new pertinent imaging studies   Assessment/Plan:  65 y.o. Mckenzie doing well 3 Days Post-Op s/p Hartman's Procedurefor perforated diverticulitis.              - ADAT             - Continue IVF resuscitation; wean - Continue IV Abx (Zosyn)              - Monitor abdominal examination; on-going colostomy function  - Pain control prn; antiemetics prn              - Midline Wound Care: Plan to transition to wound vac with WOC RN today - Mobilization encouraged; PT following; recommending HHPT   - Discharge Planning; Suspect she will  be ready for discharge home in next 48-72 hours (anticipate Monday 01/17)  All of the above findings and recommendations were discussed with the patient, and the medical team, and all of patient's questions were answered to her expressed satisfaction.  -- Lynden Oxford, PA-C  [facility] 04/01/2020, 7:27 AM [phone] M-F: 7am - 4pm

## 2020-04-01 NOTE — Progress Notes (Signed)
Physical Therapy Treatment Patient Details Name: Monica Mckenzie MRN: [id number] DOB: 1955-11-28 Today's Date: 04/01/2020    History of Present Illness Pt is a 65 yo female s/p ostomy 03/29/2020 due to ruptured diverticulum. Pt also developed ileus, NGT placed. PMH of asthma.    PT Comments    Pt was long sitting in bed upon arriving. Only endorses pain with movement. Was able to exit R side of bed, stand, and ambulate with RW and with only pushing IV pole with one UE. She is progressing well however would benefit from [facility] services to improve strength while assisting pt to PLOF. Acute PT will continue to follow per POC.   Follow Up Recommendations  Home health PT;Supervision/Assistance - 24 hour     Equipment Recommendations  Rolling walker with 5" wheels    Recommendations for Other Services       Precautions / Restrictions Precautions Precautions: Fall Precaution Comments: colostomy, JP drain, abdominal wound with dressing, wound vac Restrictions Weight Bearing Restrictions: No    Mobility  Bed Mobility Overal bed mobility: Needs Assistance Bed Mobility: Rolling;Sidelying to Sit;Sit to Sidelying Rolling: Min assist Sidelying to sit: Min assist;Mod assist     Sit to sidelying: Min assist    Transfers Overall transfer level: Needs assistance Equipment used: Rolling walker (2 wheeled) Transfers: Sit to/from Stand Sit to Stand: Supervision            Ambulation/Gait Ambulation/Gait assistance: Supervision;Min guard Gait Distance (Feet): 200 Feet Assistive device: Rolling walker (2 wheeled);IV Pole Gait Pattern/deviations: Step-through pattern Gait velocity: decreased   General Gait Details: Pt ambulated ~ 100 ft with RW and then 100 ft with pushing IV pole.       Balance Overall balance assessment: Needs assistance Sitting-balance support: Feet supported Sitting balance-Leahy Scale: Good     Standing balance support: Single extremity supported;Bilateral  upper extremity supported;During functional activity Standing balance-Leahy Scale: Fair         Cognition Arousal/Alertness: Awake/alert Behavior During Therapy: WFL for tasks assessed/performed Overall Cognitive Status: Within Functional Limits for tasks assessed        General Comments: Pt was A and O x 4. extremely pleasant and cooperative throughout             Pertinent Vitals/Pain Pain Intervention(s): Other (comment);Limited activity within patient's tolerance;Monitored during session;Premedicated before session;Repositioned           PT Goals (current goals can now be found in the care plan section) Acute Rehab PT Goals Patient Stated Goal: to go home Progress towards PT goals: Progressing toward goals    Frequency    Min 2X/week      PT Plan Current plan remains appropriate       AM-PAC PT "6 Clicks" Mobility   Outcome Measure  Help needed turning from your back to your side while in a flat bed without using bedrails?: A Little Help needed moving from lying on your back to sitting on the side of a flat bed without using bedrails?: A Little Help needed moving to and from a bed to a chair (including a wheelchair)?: A Little Help needed standing up from a chair using your arms (e.g., wheelchair or bedside chair)?: A Little Help needed to walk in hospital room?: A Little Help needed climbing 3-5 steps with a railing? : A Little 6 Click Score: 18    End of Session Equipment Utilized During Treatment: Gait belt Activity Tolerance: Patient tolerated treatment well Patient left: in bed;with call bell/phone within  reach;with nursing/sitter in room;with family/visitor present Nurse Communication: Mobility status PT Visit Diagnosis: Muscle weakness (generalized) (M62.81);Other abnormalities of gait and mobility (R26.89);Difficulty in walking, not elsewhere classified (R26.2);Pain     Time: 1440-1458 PT Time Calculation (min) (ACUTE ONLY): 18 min  Charges:   $Gait Training: 8-22 mins                     Jetta Lout PTA 04/01/20, 3:25 PM

## 2020-04-01 NOTE — Consult Note (Signed)
WOC Nurse Consult Note: Reason for Consult: Initiate NPWT to midline wound Wound type:surgical Pressure Injury POA: N/A Measurement: 19cm x 8cm x 6cm Wound bed:red, moist Drainage (amount, consistency, odor) scant serous Periwound: intact Dressing procedure/placement/frequency:  Patient is taught the mechanics of NPWT and what to expect with the dressing placement.  Wound is cleansed with NS, and surrounding skin is gently patted dry.  1 piece of black foam is used to obliterate dead space and is cut around umbilicus to avoid maceration. The umbilicus is further protected with 1/2 of a skin barrier ring. The remaining half of the skin barrier ring is used to enhance the seal at the distal portion of the wound from 5-7 o'clock. The patient is premedicated for pain. The bedside RN,  S. Iris Pert assists me with holding the foam in place while I place the drape and her experience and expertise are appreciated. The dressing is attached to continuous negative pressure and an immediate seal is achieved. The patient experienced initial discomfort and the reports tolerable "tightness" at midline wound.  WOC Nurse ostomy consult note Stoma type/location: LLQ colostomy Stomal assessment/size: Oval, 1 and 1/8 inch from top to bottom and 1 and and 5/8 inches from side to side. Dark tissue, lumen at center, near flush with abdomen. Peristomal assessment: intact Treatment options for stomal/peristomal skin: skin barrier ring, slightly flattened. Output: small amount serosanguinous effluent in pouch tail, flatus. Ostomy pouching: 2pc. 2 and 1/4 inch pouching system with skin barrier ring Education provided:  Demonstrated pouch change (cutting new skin barrier, measuring stoma, cleaning peristomal skin and stoma, use of barrier ring) Education on emptying when 1/3 to 1/2 full and how to empty Demonstrated use of wick to clean spout. Patient gives return demonstration of performing Lock and Roll closure  and describing method to empty pouch and clean pouch tail closure with toilet paper prior to resealing.  Answered patient questions.  Patient is anxious about husband having to assist with VAC and ostomy.  Reassured that a HHRN would be assisting with the NPWT dressing changes and that they would assist with poiuch changes until patient and family were comfortable performing independently. THe patient is instructed that she would be independent in emptying the ostomy pouch prior to returning home as the [facility] would not be present for the numerous times each day that the pouch would require emptying. She indicates understanding.  Patient encouraged to review ostomy education booklet.  Supplies for ostomy are in the room:  5 pouches, 5 skin barriers and 5 skin barrier rings. Supplies for NPWT are in room:  2 large NPWT dressing kits.  Next visit planned for Monday, 1/17 if still in house.  Enrolled patient in [employer] DC program: Yes, yesterday. 4 skin barriers, 4 opaque pouches with integrated gas filters, 4 skin barrier rings and 4 closed end pouches (opaque) with integrated gas filters are requested.    WOC nursing team will follow, and will remain available to this patient, the nursing and medical teams.    Thanks, Ladona Mow, MSN, RN, GNP, [facility]  Pager# [phone]

## 2020-04-01 NOTE — Care Management Important Message (Signed)
Important Message  Patient Details  Name: Monica Mckenzie MRN: [id number] Date of Birth: [birth year]   Medicare Important Message Given:  Yes     Johnell Comings 04/01/2020, 2:14 PM

## 2020-04-01 NOTE — Progress Notes (Signed)
PT Cancellation Note  Patient Details Name: Monica Mckenzie MRN: [id number] DOB: 05/23/1955   Cancelled Treatment:     PT attempt. Pt currently having wound vac placed. Will return later this date to continue to progress pt per POC.    [name] 04/01/2020, 8:46 AM

## 2020-04-01 NOTE — TOC Progression Note (Addendum)
Transition of Care [facility]) - Progression Note    Patient Details  Name: Monica Mckenzie MRN: [id number] Date of Birth: Jun 12, 1955  Transition of Care [facility]) CM/SW [place], [place] Phone Number: 04/01/2020, 4:50 PM  Clinical Narrative:     CSW met with patient her Randilyn, Foisy (Spouse) [phone] (Mobile), with status update on home health services.  CSW explained to patient and Mr. Abdulaziz the role of TOC in patient care.  CSW explained the process and estimated timeline of obtaining home health services.  CSW let the patient know that I had not been able to find a home health agency.  Patient verbalized understanding.  PA  si willing to see patient at the office to assist with wound-vac changes next week, but the patient may need to have wet/dry dressings if home health cannot be found.  Patient has some concerns about changing the colostomy bag and connection, CSW let the PA, MD and weekend MD know she wanted to speak wo someone about her concerns.  Patient has walker and wound vac delivered by [employer].  Expected Discharge Plan: Early Barriers to Discharge: No Barriers Identified,Other (comment) (Unable to find home health agency with sufficient staff due to [place].)  Expected Discharge Plan and Services Expected Discharge Plan: [place] In-house Referral: Clinical Social Work   Post Acute Care Choice: [place] arrangements for the past 2 months: Single Family Home                 DME Arranged: Vac DME Agency: [employer] Date DME Agency Contacted: 04/01/20 Time DME Agency Contacted: [phone] Representative spoke with at DME Agency: [place] (SDOH) Interventions    Readmission Risk Interventions No flowsheet data found.

## 2020-04-02 MED ORDER — ACETAMINOPHEN 500 MG PO TABS
ORAL_TABLET | ORAL | Status: AC
  Filled 2020-04-02: qty 2

## 2020-04-02 MED ORDER — PIPERACILLIN-TAZOBACTAM 3.375 G IVPB
INTRAVENOUS | Status: AC
  Filled 2020-04-02: qty 50

## 2020-04-02 MED ORDER — OXYCODONE HCL 5 MG PO TABS
5.0000 mg | ORAL_TABLET | ORAL | Status: DC | PRN
  Administered 2020-04-04: 5 mg via ORAL

## 2020-04-02 MED ORDER — ACETAMINOPHEN 500 MG PO TABS
ORAL_TABLET | ORAL | Status: AC
  Administered 2020-04-02: 1000 mg via ORAL
  Filled 2020-04-02: qty 2

## 2020-04-02 MED ORDER — IBUPROFEN 600 MG PO TABS: 600.0000 mg | ORAL_TABLET | Freq: Four times a day (QID) | ORAL | Status: DC | PRN

## 2020-04-02 MED ORDER — HYDROMORPHONE HCL 1 MG/ML IJ SOLN: 0.5000 mg | INTRAMUSCULAR | Status: DC | PRN

## 2020-04-02 NOTE — Progress Notes (Signed)
[facility] PROGRESS NOTE  Hospital Day(s): 4.   Post op day(s): 4 Days Post-Op.   Interval History:  Patient seen and examined No acute events or new complaints overnight.  Patient reports she is doing well, some abdominal soreness Wound VAC was placed yesterday and the patient says that she tolerated this well. Her surgical drain was removed due to malfunction. She is on a soft diet and tolerating this. Stoma is functional and she reports that she has been able to empty, clean, and seal it on her own.  She says that she is still not confident of her ability to change the appliance. Mobilized with PT; recommending HHPT Wound VAC and walker were delivered yesterday, but home health nursing services have not been able to be secured.  Surgical pathology reviewed; Perforated diverticulitis with abscess, negative for malignancy   Vital signs in last 24 hours: [min-max] current  Temp:  [97.4 F (36.3 C)-98.5 F (36.9 C)] 97.4 F (36.3 C) (01/15 1223) Pulse Rate:  [80-85] 83 (01/15 1223) Resp:  [17-18] 17 (01/15 1223) BP: (116-131)/(60-70) 125/62 (01/15 1223) SpO2:  [97 %-98 %] 97 % (01/15 1223)     Height: 5\' 1"  (154.9 cm) Weight: 86.2 kg BMI (Calculated): 35.92   Intake/Output last 2 shifts:  01/14 0701 - 01/15 0700 In: 1078.5 [P.O.:240; I.V.:174.6; IV Piggyback:663.9] Out: 140 [Stool:140]   Physical Exam:  Constitutional: alert, cooperative and no distress Respiratory: breathing non-labored at rest  Cardiovascular: regular rate and sinus rhythm  Gastrointestinal: soft,incisional soreness, non-distended, no rebound/guarding.  Wound VAC in midline with good suction.  Stoma slightly dusky but productive of gas and stool.   Labs:  CBC Latest Ref Rng & Units 04/01/2020 03/31/2020 03/30/2020  WBC 4.0 - 10.5 K/uL 11.9(H) 14.1(H) 16.3(H)  Hemoglobin 12.0 - 15.0 g/dL 10.2(L) 11.0(L) 13.3  Hematocrit 36.0 - 46.0 % 31.2(L) 33.2(L) 40.5  Platelets 150 - 400  K/uL 213 211 275   CMP Latest Ref Rng & Units 04/01/2020 03/31/2020 03/30/2020  Glucose 70 - 99 mg/dL 05/28/2020) 177(L) 390(Z)  BUN 8 - 23 mg/dL 15 18 20   Creatinine 0.44 - 1.00 mg/dL 009(Q 3.30  Sodium 135 - 145 mmol/L 145 147(H) 145  Potassium 3.5 - 5.1 mmol/L 3.6 4.0 4.1  Chloride 98 - 111 mmol/L 112(H) 112(H) 112(H)  CO2 22 - 32 mmol/L 26 27 23   Calcium 8.9 - 10.3 mg/dL 7.6(L) 8.0(L) 7.3(L)  Total Protein 6.5 - 8.1 g/dL - - 4.8(L)  Total Bilirubin 0.3 - 1.2 mg/dL - - 1.0  Alkaline Phos 38 - 126 U/L - - 40  AST 15 - 41 U/L - - 19  ALT 0 - 44 U/L - - 17     Imaging studies: No new pertinent imaging studies   Assessment/Plan:  65 y.o. female doing well 4 Days Post-Op s/p Hartman's Procedurefor perforated diverticulitis.              - ADAT             - Continue IVF resuscitation; wean - Continue IV Abx (Zosyn)              - Monitor abdominal examination; on-going colostomy function  - Pain control prn; antiemetics prn              - Continue wound VAC - Mobilization encouraged; PT following; recommending HHPT  - Discharge Planning: She is still not completely comfortable changing her own ostomy appliance and we are still awaiting  word as to the possibility of home health assistance, although home wound VAC has been delivered.  Anticipate discharge on Monday.

## 2020-04-02 NOTE — Consult Note (Addendum)
WOC Nurse ostomy follow up Consult request for an additional session today for ostomy education.  I have communicated with Dr. Everlene Farrier via Secure Chat that the [facility] Nursing team is not available on weekends, but that the patient and her husband have had sufficient education and practice opportunities to be successful at home with [facility] support for reinforcement of education provided in hospital. [facility] visit is due on Monday, 1/17 unless patient is still in house for NPWT dressing change. Patient has sufficient supplies for discharge, the mobile number of her ostomy nurse for questions and has been established with a post acute resource [employer]) who has sent additional supplies to her home.  Patient and her husband have had two elongated teaching sessions and have step-by-step written and pictorial instructions for an ostomy pouch change should an urgent need to change present before the next scheduled change on Tuesday, January 18.  WOC nursing team will follow, and will remain available to this patient, the nursing and medical teams.   Thanks, Ladona Mow, MSN, RN, GNP, [place]  Pager# [phone]

## 2020-04-03 LAB — CULTURE, BLOOD (ROUTINE X 2)
Culture: NO GROWTH
Culture: NO GROWTH
Special Requests: ADEQUATE
Special Requests: ADEQUATE

## 2020-04-03 MED ORDER — ACETAMINOPHEN 500 MG PO TABS
ORAL_TABLET | ORAL | Status: AC
  Filled 2020-04-03: qty 2

## 2020-04-03 MED ORDER — ACETAMINOPHEN 500 MG PO TABS
ORAL_TABLET | ORAL | Status: AC
  Administered 2020-04-03: 1000 mg via ORAL
  Filled 2020-04-03: qty 2

## 2020-04-03 NOTE — Progress Notes (Signed)
[facility] SURGICAL PROGRESS NOTE  Hospital Day(s): 5.   Post op day(s): 5 Days Post-Op.   Interval History:  Patient seen and examined No acute events or new complaints overnight.  Patient reports she is doing well, some abdominal soreness She is tolerating the wound VAC Her surgical drain was removed due to malfunction. She is on a soft diet and tolerating this. She has been emptying her ostomy bag and reports that the stool is appearing somewhat less liquid. Mobilized with PT; recommending HHPT Wound VAC and walker were delivered yesterday, but home health nursing services have not been able to be secured.  Surgical pathology reviewed; Perforated diverticulitis with abscess, negative for malignancy   Vital signs in last 24 hours: [min-max] current  Temp:  [97.5 F (36.4 C)-98.7 F (37.1 C)] 98 F (36.7 C) (01/16 1500) Pulse Rate:  [77-93] 86 (01/16 1500) Resp:  [17-18] 17 (01/16 1500) BP: (141-162)/(70-76) 141/76 (01/16 1500) SpO2:  [95 %-97 %] 97 % (01/16 1500)     Height: 5\' 1"  (154.9 cm) Weight: 86.2 kg BMI (Calculated): 35.92   Intake/Output last 2 shifts:  01/15 0701 - 01/16 0700 In: 287 [P.O.:237; IV Piggyback:50] Out: -    Physical Exam:  Constitutional: alert, cooperative and no distress Respiratory: breathing non-labored at rest  Cardiovascular: regular rate and sinus rhythm  Gastrointestinal: soft,incisional soreness, non-distended, no rebound/guarding.  Wound VAC in midline with good suction.  Stoma slightly dusky but productive of gas and stool.   Labs:  CBC Latest Ref Rng & Units 04/01/2020 03/31/2020 03/30/2020  WBC 4.0 - 10.5 K/uL 11.9(H) 14.1(H) 16.3(H)  Hemoglobin 12.0 - 15.0 g/dL 10.2(L) 11.0(L) 13.3  Hematocrit 36.0 - 46.0 % 31.2(L) 33.2(L) 40.5  Platelets 150 - 400 K/uL 213 211 275   CMP Latest Ref Rng & Units 04/01/2020 03/31/2020 03/30/2020  Glucose 70 - 99 mg/dL 05/28/2020) 462(V) 035(K)  BUN 8 - 23 mg/dL 15 18 20   Creatinine  0.44 - 1.00 mg/dL 093(G 1.82  Sodium 135 - 145 mmol/L 145 147(H) 145  Potassium 3.5 - 5.1 mmol/L 3.6 4.0 4.1  Chloride 98 - 111 mmol/L 112(H) 112(H) 112(H)  CO2 22 - 32 mmol/L 26 27 23   Calcium 8.9 - 10.3 mg/dL 7.6(L) 8.0(L) 7.3(L)  Total Protein 6.5 - 8.1 g/dL - - 4.8(L)  Total Bilirubin 0.3 - 1.2 mg/dL - - 1.0  Alkaline Phos 38 - 126 U/L - - 40  AST 15 - 41 U/L - - 19  ALT 0 - 44 U/L - - 17     Imaging studies: No new pertinent imaging studies   Assessment/Plan:  65 y.o. female doing well 5 Days Post-Op s/p Hartman's Procedurefor perforated diverticulitis.              - ADAT             - Continue IVF resuscitation; wean - Continue IV Abx (Zosyn)              - Monitor abdominal examination; on-going colostomy function  - Pain control prn; antiemetics prn              - Continue wound VAC - Mobilization encouraged; PT following; recommending HHPT  - Discharge Planning: She is still not completely comfortable changing her own ostomy appliance and we are still awaiting word as to the possibility of home health assistance, although home wound VAC has been delivered.  Anticipate discharge on Monday.

## 2020-04-03 NOTE — Progress Notes (Signed)
Dietary called to bring dietary protein supplement drinks.  As of this note, dietary has not been to unit with supplement drinks.  Pt still "working on" drink from this morning breakfast tray.

## 2020-04-04 MED ORDER — ACETAMINOPHEN 500 MG PO TABS
ORAL_TABLET | ORAL | Status: AC
  Administered 2020-04-04: 1000 mg via ORAL
  Filled 2020-04-04: qty 2

## 2020-04-04 MED ORDER — OXYCODONE HCL 5 MG PO TABS
ORAL_TABLET | ORAL | Status: AC
  Administered 2020-04-05: 5 mg via ORAL
  Filled 2020-04-04: qty 1

## 2020-04-04 MED ORDER — PIPERACILLIN-TAZOBACTAM 3.375 G IVPB
INTRAVENOUS | Status: AC
  Filled 2020-04-04: qty 50

## 2020-04-04 MED ORDER — ACETAMINOPHEN 500 MG PO TABS
ORAL_TABLET | ORAL | Status: AC
  Filled 2020-04-04: qty 2

## 2020-04-04 MED ORDER — SODIUM CHLORIDE FLUSH 0.9 % IV SOLN
INTRAVENOUS | Status: AC
  Filled 2020-04-04: qty 40

## 2020-04-04 MED ORDER — PIPERACILLIN-TAZOBACTAM 3.375 G IVPB
INTRAVENOUS | Status: AC
  Administered 2020-04-04: 3.375 g via INTRAVENOUS
  Filled 2020-04-04: qty 50

## 2020-04-04 NOTE — Progress Notes (Signed)
Physical Therapy Treatment Patient Details Name: Monica Mckenzie MRN: [id number] DOB: 12-Mar-1956 Today's Date: 04/04/2020    History of Present Illness Pt is a 65 yo female s/p ostomy 03/29/2020 due to ruptured diverticulum. Pt also developed ileus, NGT placed. PMH of asthma.    PT Comments    Pt is making good progress towards goals. Just finished ambulating to bathroom with RN, receiving seated at EOB. Agreeable to therapy. Safe technique with use of RW. Hopeful for discharge soon as HH able to accept patient. Good safety awareness of lines/leads. Will continue to progress as able.   Follow Up Recommendations  Home health PT     Equipment Recommendations   (has RW)    Recommendations for Other Services       Precautions / Restrictions Precautions Precautions: Fall Precaution Comments: colostomy, wound vac Restrictions Weight Bearing Restrictions: No    Mobility  Bed Mobility Overal bed mobility: Needs Assistance Bed Mobility: Sit to Supine       Sit to supine: Min assist   General bed mobility comments: needs assist for transitioning B LE onto bed. Once in bed, able to position self for comfort  Transfers Overall transfer level: Needs assistance Equipment used: Rolling walker (2 wheeled) Transfers: Sit to/from Stand Sit to Stand: Supervision         General transfer comment: cues for hand placement. Slow technique  Ambulation/Gait Ambulation/Gait assistance: Supervision Gait Distance (Feet): 200 Feet Assistive device: Rolling walker (2 wheeled);IV Pole Gait Pattern/deviations: Step-through pattern     General Gait Details: reciprocal gait pattern with safe technique.   Stairs             Wheelchair Mobility    Modified Rankin (Stroke Patients Only)       Balance Overall balance assessment: Needs assistance Sitting-balance support: Feet supported Sitting balance-Leahy Scale: Good     Standing balance support: Bilateral upper extremity  supported;During functional activity Standing balance-Leahy Scale: Good                              Cognition Arousal/Alertness: Awake/alert Behavior During Therapy: WFL for tasks assessed/performed Overall Cognitive Status: Within Functional Limits for tasks assessed                                        Exercises      General Comments        Pertinent Vitals/Pain Pain Assessment: No/denies pain    Home Living                      Prior Function            PT Goals (current goals can now be found in the care plan section) Acute Rehab PT Goals Patient Stated Goal: to go home PT Goal Formulation: With patient Time For Goal Achievement: 04/13/20 Potential to Achieve Goals: Good Progress towards PT goals: Progressing toward goals    Frequency    Min 2X/week      PT Plan Current plan remains appropriate    Co-evaluation              AM-PAC PT "6 Clicks" Mobility   Outcome Measure  Help needed turning from your back to your side while in a flat bed without using bedrails?: A Little Help needed moving from lying on your back  to sitting on the side of a flat bed without using bedrails?: A Little Help needed moving to and from a bed to a chair (including a wheelchair)?: A Little Help needed standing up from a chair using your arms (e.g., wheelchair or bedside chair)?: A Little Help needed to walk in hospital room?: A Little Help needed climbing 3-5 steps with a railing? : A Little 6 Click Score: 18    End of Session   Activity Tolerance: Patient tolerated treatment well Patient left: in bed;with call bell/phone within reach Nurse Communication: Mobility status PT Visit Diagnosis: Muscle weakness (generalized) (M62.81);Other abnormalities of gait and mobility (R26.89);Difficulty in walking, not elsewhere classified (R26.2);Pain     Time: 1039-1050 PT Time Calculation (min) (ACUTE ONLY): 11 min  Charges:  $Gait  Training: 8-22 mins                     Elizabeth Palau, [place], DPT [phone]    Chrisha Vogel 04/04/2020, 10:59 AM

## 2020-04-04 NOTE — Care Management Important Message (Signed)
Important Message  Patient Details  Name: Monica Mckenzie MRN: [id number] Date of Birth: 1955/11/17   Medicare Important Message Given:  Yes     Johnell Comings 04/04/2020, 12:21 PM

## 2020-04-04 NOTE — TOC Progression Note (Addendum)
Transition of Care [facility]) - Progression Note    Patient Details  Name: Monica Mckenzie MRN: [id number] Date of Birth: 04-Nov-1955  [facility]) CM/SW Contact  [place] Cellar, RN Phone Number: 04/04/2020, 10:09 AM  Clinical Narrative:    Outreach to [place] @ [facility] requesting availability for nursing to include ostomy and wound vac as well as Physical Therapy.   10:49am: [place] unable to accept patient. Secure email sent to OP wound care center for soonest availability.    Expected Discharge Plan: Home w Home Health Services Barriers to Discharge: No Barriers Identified,Other (comment) (Unable to find home health agency with sufficient staff due to COVID.)  Expected Discharge Plan and Services Expected Discharge Plan: Home w Home Health Services In-house Referral: Clinical Social Work   Post Acute Care Choice: Home Health Living arrangements for the past 2 months: Single Family Home                 DME Arranged: Vac DME Agency: [employer] Date DME Agency Contacted: 04/01/20 Time DME Agency Contacted: [phone] Representative spoke with at DME Agency: Oletha Cruel             Social Determinants of Health (SDOH) Interventions    Readmission Risk Interventions No flowsheet data found.

## 2020-04-04 NOTE — Consult Note (Signed)
WOC Nurse ostomy follow up Stoma type/location: LLQ, end colostomy  Stomal assessment/size: oval shaped; black along proximal aspect of the stoma. Os central but stoma flush  Peristomal assessment: early mucocutaneous separation very minimal but with flush stoma may progress  Treatment options for stomal/peristomal skin: 2" skin barrier ring Output pasty green stool Ostomy pouching: 2pc. 2 1/4" with 2" skin barrier ring Education provided:  Allowed patient to perform most of pouch change; she is independent with emptying and cleaning spout with toilet paper Patient cut new skin barrier; I cleansed her skin but discussed use of water only Patient observed but felt the skin barrier ring and verbalize understanding of placement on her skin  Patient removed backing from skin barrier but with assistance placed skin barrier; difficult to see while in bed  Explained that pulling skin of abdomen up and tight would assist with better visualization  She attached the pouch to the skin barrier  Enrolled patient in [place] Secure Start Discharge program: Yes; requested additional samples of flex convex due to flush stoma; belt and lubrication gtts.   WOC Nurse team will follow with you and see patient within 10 days for wound assessments.  Please notify WOC nurses of any acute changes in the wounds or any new areas of concern Lyncoln Ledgerwood [facility] MSN, RN,CWOCN, CNS, CWON-AP [phone]

## 2020-04-04 NOTE — Progress Notes (Signed)
[facility] SUR[facility]ICAL PROGRESS NOTE  Hospital Day(s): 6.   Post op day(s): 6 Days Post-Op.   Interval History:  Patient seen and examined No acute events or new complaints overnight.  Patient reports she had a rough night and felt "that she overdid it" but feels much better this morning Abdominal discomfort minimal, not requiring much pain medications No fever, chills, nausea, emesis Tolerating soft diet Colostomy functioning No issues with wound vac Worked with PT'; recommending HHPT  Disposition delay given lack of available home health nursing and safe plan for midline wound management.   Vital signs in last 24 hours: [min-max] current  Temp:  [97.3 F (36.3 C)-99.1 F (37.3 C)] 98.4 F (36.9 C) (01/17 0905) Pulse Rate:  [86-89] 87 (01/17 0905) Resp:  [15-17] 16 (01/17 0905) BP: (124-164)/(70-88) 124/73 (01/17 0905) SpO2:  [96 %-99 %] 98 % (01/17 0905)     Height: 5\' 1"  (154.9 cm) Weight: 86.2 kg BMI (Calculated): 35.92   Intake/Output last 2 shifts:  01/16 0701 - 01/17 0700 In: 100 [IV Piggyback:100] Out: -    Physical Exam:  Constitutional: alert, cooperative and no distress Respiratory: breathing non-labored at rest  Cardiovascular: regular rate and sinus rhythm  Gastrointestinal: soft,incisional soreness, non-distended, no rebound/guarding. Surgical drain in RLQ with serosanguinous output, colostomy in left mid-abdomen, dusky appearance as expected, stool and gas in bag Integumentary: Midline wound healing via secondary intention, there was fibrinous exudate in the wound with vac removal, no evidence of fistula or fascial compromise, no erythema, wound is beginning to granulate as expected.   Midline Wound (04/04/2020):     Labs:  CBC Latest Ref Rng & Units 04/01/2020 03/31/2020 03/30/2020  WBC 4.0 - 10.5 K/uL 11.9(H) 14.1(H) 16.3(H)  Hemoglobin 12.0 - 15.0 g/dL 10.2(L) 11.0(L) 13.3  Hematocrit 36.0 - 46.0 % 31.2(L) 33.2(L) 40.5  Platelets 150  - 400 K/uL 213 211 275   CMP Latest Ref Rng & Units 04/01/2020 03/31/2020 03/30/2020  Glucose 70 - 99 mg/dL 05/28/2020) 782(U) 235(T)  BUN 8 - 23 mg/dL 15 18 20   Creatinine 0.44 - 1.00 mg/dL 614(E 3.15  Sodium 135 - 145 mmol/L 145 147(H) 145  Potassium 3.5 - 5.1 mmol/L 3.6 4.0 4.1  Chloride 98 - 111 mmol/L 112(H) 112(H) 112(H)  CO2 22 - 32 mmol/L 26 27 23   Calcium 8.9 - 10.3 mg/dL 7.6(L) 8.0(L) 7.3(L)  Total Protein 6.5 - 8.1 g/dL - - 4.8(L)  Total Bilirubin 0.3 - 1.2 mg/dL - - 1.0  Alkaline Phos 38 - 126 U/L - - 40  AST 15 - 41 U/L - - 19  ALT 0 - 44 U/L - - 17     Imaging studies: No new pertinent imaging studies   Assessment/Plan:  65 y.o. female  6 Days Post-Op s/p Hartman's Procedurefor perforated diverticulitis.   - Continue soft diet + nutritional supplementation   - Continue IV Abx (Zosyn); complete PO course for home - Monitor abdominal examination; on-going colostomy function    - Midline wound care: On wound vac removal today there was some fibrinous appearing drainage which was initially concerning however on full evaluation of the wound there was no evidence of infection, fistula, nor fascial compromise. As a precaution, I will leave on wet-to-dry dressing for today. I will place home vac tomorrow morning pending re-evaluation of her wound.    - Mobilization encouraged; PT following; recommending HHPT   - Discharge Planning: Unfortunately, establishing home health has been her biggest barrier to safe  discharge. I offered her discharge today with wet-to-dry dressing vs discharge tomorrow with plan for wound vac changes in the office. She prefers to go home with wound vac if possible. We will plan for discharge tomorrow morning with home wound vac. She will get her first vac change in office on Friday (01/21) with Dr Everlene Farrier. If home health can not be established, I will see her MWF in office for vac changes until home health established. She is in agreement with this  plan.   All of the above findings and recommendations were discussed with the patient, and the medical team, and all of patient's questions were answered to her expressed satisfaction.  All above findings and plan reviewed with Dr Everlene Farrier.   -- Lynden Oxford, PA-C [facility] 04/04/2020, 11:56 AM [phone] M-F: 7am - 4pm

## 2020-04-04 NOTE — Discharge Summary (Signed)
[facility] SUR[facility]ICAL DISCHARGE SUMMARY  Patient ID: Monica Mckenzie MRN: [id number] DOB/AGE: 66/13/1957 66 y.o.  Admit date: 03/29/2020 Discharge date: 04/05/2020  Discharge Diagnoses Patient Active Problem List   Diagnosis Date Noted  . Diverticulitis of large intestine with perforation without bleeding 03/29/2020    Consultants None  Procedures 03/29/2020 1.Hartmann's Procedure (sigmoid colectomy w/ end colostomy) 2. Takedown of  splenic flexure 3. Placement 19 FR blake pelvis  HPI: 65 y.o. female presented to [facility] ED today for abdominal pain. Patient reports the acute onset of severe and sharp LLQ abdominal pain on Thursday evening. This pain radiated across her abdomen. She got no relief from this. She reports that even simple movements greatly exacerbated the pain. She endorsed associated nausea, decreased appetite with the pain. No fever, chills, cough, SOB, CP, emesis, or bowel changes. She denied any history of similar in the past. No previous history of diverticulitis. No previous abdominal surgeries. Work up in the ED revealed leukocytosis to 16.2k and CT Abdomen/Pelvis was concerning for diverticulitis with scattered pneumoperitoneum.   Hospital Course: Informed consent was obtained and documented, and patient underwent uneventful Hartman's Procedure (Dr Everlene Farrier, 03/29/2020).  Post-operatively, patient did have an expected mild ileus. On POD2, she passed NGT clamping trial with return of bowel function and diet was initiated. Advancement of patient's diet and ambulation were well-tolerated. She had wound vac placed to midline wound on POD3. The remainder of patient's hospital course was essentially unremarkable, and discharge planning was initiated accordingly with patient safely able to be discharged home with appropriate discharge instructions, antibiotics (augmentin x7 days to complete 14 days total), pain control, and outpatient follow-up after all of her and her  family's questions were answered to their expressed satisfaction.   Discharge Condition: Good   Physical Examination:  Constitutional: alert, cooperative and no distress Respiratory: breathing non-labored at rest  Cardiovascular: regular rate and sinus rhythm  Gastrointestinal: soft,incisional soreness, non-distended, no rebound/guarding. Wound VAC in midline with good seal. Stoma in left mid-abdomen is slightly dusky but productive of gas and stool.   Allergies as of 04/05/2020   No Known Allergies     Medication List    TAKE these medications   amoxicillin-clavulanate 875-125 MG tablet Commonly known as: Augmentin Take 1 tablet by mouth 2 (two) times daily for 7 days.   AZO TABS PO Take 2 capsules by mouth daily.   CALTRATE 600+D3 SOFT PO Take 1 tablet by mouth daily.   CENTRUM SILVER ULTRA WOMENS PO Take 1 tablet by mouth daily.   ibuprofen 600 MG tablet Commonly known as: ADVIL Take 1 tablet (600 mg total) by mouth every 6 (six) hours as needed for mild pain, moderate pain, fever or headache.   montelukast 10 MG tablet Commonly known as: SINGULAIR Take 10 mg by mouth at bedtime.   oxyCODONE 5 MG immediate release tablet Commonly known as: Oxy IR/ROXICODONE Take 1 tablet (5 mg total) by mouth every 6 (six) hours as needed for severe pain or breakthrough pain (with dressing changes).            Discharge Care Instructions  (From admission, onward)         Start     Ordered   04/05/20 0000  Discharge wound care:       Comments: Wound vac changes MWF   04/05/20 0854            Follow-up Information    Pabon, Merri Ray, MD. Schedule an appointment as soon as  possible for a visit on 04/08/2020.   Specialty: General Surgery Why: Make appointment for vac change with Dr Everlene Farrier on 01/21 at [facility] She will also need an appointment with Ian Malkin PA on 01/24 for same  Contact information: [address] [place] [address] [phone]                 Time spent on discharge management including discussion of hospital course, clinical condition, outpatient instructions, prescriptions, and follow up with the patient and members of the medical team: >30 minutes  -- Lynden Oxford , PA-C [facility]  04/05/2020, 8:55 AM [phone] M-F: 7am - 4pm

## 2020-04-05 MED ORDER — AMOXICILLIN-POT CLAVULANATE 875-125 MG PO TABS: 1.0000 | ORAL_TABLET | Freq: Two times a day (BID) | ORAL | 0 refills | Status: AC

## 2020-04-05 MED ORDER — OXYCODONE HCL 5 MG PO TABS
ORAL_TABLET | ORAL | Status: AC
  Filled 2020-04-05: qty 1

## 2020-04-05 MED ORDER — IBUPROFEN 600 MG PO TABS: 600.0000 mg | ORAL_TABLET | Freq: Four times a day (QID) | ORAL | 0 refills | Status: DC | PRN

## 2020-04-05 MED ORDER — ACETAMINOPHEN 500 MG PO TABS
ORAL_TABLET | ORAL | Status: AC
  Administered 2020-04-05: 1000 mg via ORAL
  Filled 2020-04-05: qty 2

## 2020-04-05 MED ORDER — PIPERACILLIN-TAZOBACTAM 3.375 G IVPB
INTRAVENOUS | Status: AC
  Filled 2020-04-05: qty 50

## 2020-04-05 MED ORDER — OXYCODONE HCL 5 MG PO TABS: 5.0000 mg | ORAL_TABLET | Freq: Four times a day (QID) | ORAL | 0 refills | Status: DC | PRN

## 2020-04-05 NOTE — Consult Note (Signed)
[facility] Nurse ostomy follow up Stoma type/location: LLQ, end colosotmy Pouch intact from yesterday pouch change  Ostomy pouching: 2pc. 2 1/4" with 2" barrier ring Sending home with 7 pouching sets and 7 barrier rings; educational supplies  Education provided:  Met with patient prior to DC to answer any additional questions.  Provided [employer] and marked items she is using for her to initiate contact if needed.  Not sure when [facility] will start coming out Enrolled patient in [facility] Discharge program: Yes  [place], [place], [place]

## 2020-04-05 NOTE — TOC Transition Note (Addendum)
Transition of Care [facility]) - CM/SW Discharge Note   Patient Details  Name: Laporcha Marchesi MRN: [id number] Date of Birth: 1955/09/26  [facility]) CM/SW Contact:  Marina Goodell Phone Number: [phone] 04/05/2020, 1:57 PM   Clinical Narrative:     Patient d/chome with out patient wound vac, she will go to doctor's office to change wound vac and out patient PT/OT. Paperwork sent to [facility],  faxed to [phone].  Marland KitchenTOC consult completed.  Final next level of care: Home w Home Health Services Barriers to Discharge: No Barriers Identified,Other (comment) (Unable to find home health agency with sufficient staff due to COVID.)   Patient Goals and CMS Choice        Discharge Placement                       Discharge Plan and Services In-house Referral: Clinical Social Work   Post Acute Care Choice: Home Health          DME Arranged: Vac DME Agency: [employer] Date DME Agency Contacted: 04/01/20 Time DME Agency Contacted: [phone] Representative spoke with at DME Agency: Oletha Cruel            Social Determinants of Health (SDOH) Interventions     Readmission Risk Interventions No flowsheet data found.

## 2020-04-05 NOTE — Progress Notes (Signed)
Nsg Discharge Note  Admit Date:  03/29/2020 Discharge date: 04/05/2020   Okey Regal Hossain to be D/C'd Home per MD order.  AVS completed.  Patient/caregiver able to verbalize understanding.  Discharge Medication: Allergies as of 04/05/2020   No Known Allergies     Medication List    TAKE these medications   amoxicillin-clavulanate 875-125 MG tablet Commonly known as: Augmentin Take 1 tablet by mouth 2 (two) times daily for 7 days.   AZO TABS PO Take 2 capsules by mouth daily.   CALTRATE 600+D3 SOFT PO Take 1 tablet by mouth daily.   CENTRUM SILVER ULTRA WOMENS PO Take 1 tablet by mouth daily.   ibuprofen 600 MG tablet Commonly known as: ADVIL Take 1 tablet (600 mg total) by mouth every 6 (six) hours as needed for mild pain, moderate pain, fever or headache.   montelukast 10 MG tablet Commonly known as: SINGULAIR Take 10 mg by mouth at bedtime.   oxyCODONE 5 MG immediate release tablet Commonly known as: Oxy IR/ROXICODONE Take 1 tablet (5 mg total) by mouth every 6 (six) hours as needed for severe pain or breakthrough pain (with dressing changes).            Discharge Care Instructions  (From admission, onward)         Start     Ordered   04/05/20 0000  Discharge wound care:       Comments: Wound vac changes MWF   04/05/20 0854          Discharge Assessment: Vitals:   04/04/20 0905 04/05/20 1010  BP: 124/73 (!) 148/64  Pulse: 87 (!) 106  Resp: 16 14  Temp: 98.4 F (36.9 C) 97.8 F (36.6 C)  SpO2: 98% 98%   Skin clean, dry and intact without evidence of skin break down, no evidence of skin tears noted. IV catheter discontinued intact. Site without signs and symptoms of complications - no redness or edema noted at insertion site, patient denies c/o pain - only slight tenderness at site.  Dressing with slight pressure applied.  D/c Instructions-Education: Discharge instructions given to patient/family with verbalized understanding. D/c education  completed with patient/family including follow up instructions, medication list, d/c activities limitations if indicated, with other d/c instructions as indicated by MD - patient able to verbalize understanding, all questions fully answered. Patient instructed to return to ED, call 911, or call MD for any changes in condition.  Patient escorted via WC, and D/C home via private auto.  Theodore Demark, RN 04/05/2020 10:13 AM

## 2020-04-05 NOTE — Discharge Instructions (Signed)
In addition to included general post-operative instructions for colectomy and colostomy,  Diet: Resume home diet.   Activity: No heavy lifting >20 pounds (children, pets, laundry, garbage) 6 weeks, but light activity and walking are encouraged. Do not drive or drink alcohol if taking narcotic pain medications or having pain that might distract from driving.  Wound care: You may shower/get incision wet with soapy water and pat dry (do not rub incisions), but no baths or submerging incision underwater until follow-up. Continue wound vac changes MWF schedule, your first vac change will be with Dr Everlene Farrier on Friday 01/21.   Medications: Resume all home medications. For mild to moderate pain: acetaminophen (Tylenol) or ibuprofen/naproxen (if no kidney disease). Combining Tylenol with alcohol can substantially increase your risk of causing liver disease. Narcotic pain medications, if prescribed, can be used for severe pain, though may cause nausea, constipation, and drowsiness. Do not combine Tylenol and Percocet (or similar) within a 6 hour period as Percocet (and similar) contain(s) Tylenol. If you do not need the narcotic pain medication, you do not need to fill the prescription.  Call office [phone] / [phone]) at any time if any questions, worsening pain, fevers/chills, bleeding, drainage from incision site, or other concerns.

## 2020-04-06 ENCOUNTER — Encounter: Payer: Self-pay | Admitting: Surgery

## 2020-04-06 ENCOUNTER — Other Ambulatory Visit: Payer: Self-pay

## 2020-04-06 ENCOUNTER — Ambulatory Visit (INDEPENDENT_AMBULATORY_CARE_PROVIDER_SITE_OTHER): Payer: Medicare HMO | Admitting: Surgery

## 2020-04-06 DIAGNOSIS — Z09 Encounter for follow-up examination after completed treatment for conditions other than malignant neoplasm: Secondary | ICD-10-CM

## 2020-04-06 LAB — AEROBIC/ANAEROBIC CULTURE W GRAM STAIN (SURGICAL/DEEP WOUND)

## 2020-04-06 NOTE — Anesthesia Postprocedure Evaluation (Signed)
Anesthesia Post Note  Patient: Monica Mckenzie  Procedure(s) Performed: COLECTOMY WITH COLOSTOMY CREATION/HARTMANN PROCEDURE (N/A )  Patient location during evaluation: PACU Anesthesia Type: General Level of consciousness: awake and alert Pain management: pain level controlled Vital Signs Assessment: post-procedure vital signs reviewed and stable Respiratory status: spontaneous breathing, nonlabored ventilation, respiratory function stable and patient connected to nasal cannula oxygen Cardiovascular status: blood pressure returned to baseline and stable Postop Assessment: no apparent nausea or vomiting Anesthetic complications: no   No complications documented.   Last Vitals:  Vitals:   04/04/20 0905 04/05/20 1010  BP: 124/73 (!) 148/64  Pulse: 87 (!) 106  Resp: 16 14  Temp: 36.9 C 36.6 C  SpO2: 98% 98%    Last Pain:  Vitals:   04/05/20 1010  TempSrc: Temporal  PainSc:                  Yevette Edwards

## 2020-04-06 NOTE — Progress Notes (Signed)
[name] is a 65 year old female well-known to me status post Hartman's procedure.  She was discharged yesterday.  She called last night and was having some issues with wound VAC.  She is very anxious.  She is taking p.o. on her colostomy as well as. She is very anxious about wound VAC at wishes to switch to wet-to-dry She is tolerating regular diet and she is ambulating She comes with her husband who is very involved  Physical exam: She is in no acute distress.  Abdomen is soft, wound VAC seems to be working appropriately.  I remove the wound VAC and there is evidence of good granulation tissue.  Fascia seems to be intact.  There is no evidence of infection. Colostomy is working well with good output.  A/P Doing very well due to logistical patient preference we will switch to wet-to-dry dressing daily. I actually thought the husband in detail about palliative care for the wound.  They seem very comfortable.  Return to clinic in 2 weeks

## 2020-04-06 NOTE — TOC Progression Note (Signed)
Transition of Care [facility]) - Progression Note    Patient Details  Name: Monica Mckenzie MRN: [id number] Date of Birth: Jul 04, 1955  [facility]) CM/SW Contact  Joseph Art, [place] Phone Number: 04/06/2020, 12:12 PM  Clinical Narrative:     04/06/2020 - Emailed out-patient PT/Ot form to Dr. Demetrios Loll for signature.  Expected Discharge Plan: Home w Home Health Services Barriers to Discharge: No Barriers Identified,Other (comment) (Unable to find home health agency with sufficient staff due to COVID.)  Expected Discharge Plan and Services Expected Discharge Plan: Home w Home Health Services In-house Referral: Clinical Social Work   Post Acute Care Choice: Home Health Living arrangements for the past 2 months: Single Family Home Expected Discharge Date: 04/05/20               DME Arranged: [facility] DME Agency: [employer] Date DME Agency Contacted: 04/01/20 Time DME Agency Contacted: [phone] Representative spoke with at DME Agency: Oletha Cruel             Social Determinants of Health (SDOH) Interventions    Readmission Risk Interventions No flowsheet data found.

## 2020-04-06 NOTE — Patient Instructions (Signed)
GENERAL POST-OPERATIVE PATIENT INSTRUCTIONS   WOUND CARE INSTRUCTIONS:  Keep a dry clean dressing on the wound if there is drainage. The initial bandage may be removed after 24 hours.  Once the wound has quit draining you may leave it open to air.  If clothing rubs against the wound or causes irritation and the wound is not draining you may cover it with a dry dressing during the daytime.  Try to keep the wound dry and avoid ointments on the wound unless directed to do so.  If the wound becomes bright red and painful or starts to drain infected material that is not clear, please contact your physician immediately.  If the wound is mildly pink and has a thick firm ridge underneath it, this is normal, and is referred to as a healing ridge.  This will resolve over the next 4-6 weeks.  BATHING: You may shower if you have been informed of this by your surgeon. However, Please do not submerge in a tub, hot tub, or pool until incisions are completely sealed or have been told by your surgeon that you may do so.  DIET:  You may eat any foods that you can tolerate.  It is a good idea to eat a high fiber diet and take in plenty of fluids to prevent constipation.  If you do become constipated you may want to take a mild laxative or take ducolax tablets on a daily basis until your bowel habits are regular.  Constipation can be very uncomfortable, along with straining, after recent surgery.  ACTIVITY:  You are encouraged to cough and deep breath or use your incentive spirometer if you were given one, every 15-30 minutes when awake.  This will help prevent respiratory complications and low grade fevers post-operatively if you had a general anesthetic.  You may want to hug a pillow when coughing and sneezing to add additional support to the surgical area, if you had abdominal or chest surgery, which will decrease pain during these times.  You are encouraged to walk and engage in light activity for the next two weeks.  You  should not lift more than 20 pounds, until 4 to 6 weeks after surgery as it could put you at increased risk for complications.  Twenty pounds is roughly equivalent to a plastic bag of groceries. At that time- Listen to your body when lifting, if you have pain when lifting, stop and then try again in a few days. Soreness after doing exercises or activities of daily living is normal as you get back in to your normal routine.  MEDICATIONS:  Try to take narcotic medications and anti-inflammatory medications, such as tylenol, ibuprofen, naprosyn, etc., with food.  This will minimize stomach upset from the medication.  Should you develop nausea and vomiting from the pain medication, or develop a rash, please discontinue the medication and contact your physician.  You should not drive, make important decisions, or operate machinery when taking narcotic pain medication.  SUNBLOCK Use sun block to incision area over the next year if this area will be exposed to sun. This helps decrease scarring and will allow you avoid a permanent darkened area over your incision.  QUESTIONS:  Please feel free to call our office if you have any questions, and we will be glad to assist you. [phone].    

## 2020-04-07 DIAGNOSIS — Z933 Colostomy status: Secondary | ICD-10-CM | POA: Diagnosis not present

## 2020-04-07 DIAGNOSIS — K5732 Diverticulitis of large intestine without perforation or abscess without bleeding: Secondary | ICD-10-CM | POA: Diagnosis not present

## 2020-04-13 ENCOUNTER — Other Ambulatory Visit: Payer: Self-pay

## 2020-04-13 ENCOUNTER — Encounter: Payer: Self-pay | Admitting: Surgery

## 2020-04-13 ENCOUNTER — Ambulatory Visit (INDEPENDENT_AMBULATORY_CARE_PROVIDER_SITE_OTHER): Payer: Medicare HMO | Admitting: Surgery

## 2020-04-13 VITALS — BP 111/75 | HR 120 | Temp 98.5°F | Ht 61.0 in | Wt 179.2 lb

## 2020-04-13 DIAGNOSIS — F411 Generalized anxiety disorder: Secondary | ICD-10-CM

## 2020-04-13 MED ORDER — LOPERAMIDE HCL 2 MG PO TABS: 4.0000 mg | ORAL_TABLET | Freq: Four times a day (QID) | ORAL | 1 refills | Status: DC | PRN

## 2020-04-13 MED ORDER — ALPRAZOLAM 0.5 MG PO TABS: 0.5000 mg | ORAL_TABLET | Freq: Two times a day (BID) | ORAL | 0 refills | Status: DC | PRN

## 2020-04-13 NOTE — Patient Instructions (Addendum)
You may need to titrate the dosage- avoid constipation and diarrhea. Pick up your medication at the pharmacy. Please keep your appointment next week.

## 2020-04-14 NOTE — Progress Notes (Signed)
Monica Mckenzie is status post Hartman's procedure for perforated diverticulitis.  She is doing well.  No fevers no chills.  Her wound is healing by secondary intention.  We had some issues due to retraction from her colostomy. Is taking regular diet and having good ostomy output Ability to get home health or ostomy nurse due to Covid search and lack of the staff  PE NAD Abd: soft, open wound healing well by secondary intention.  No evidence of infection.  I changed her colostomy and there is evidence of significant retraction. Epidermal junction has retracted.  However the colostomy is working well and is patent.  I tailored a ring around the skin to protect it.  There was some evidence of excoriation from irritation of feculent material.  I talked the husband and the patient in detail.  A/P doing well after Hartman's colostomy is working but there is retraction.  I am hoping that I do not have to revise it given that she is still fragile.  I will see her back in 1 to 2 weeks depending on her condition.  Most important order of business is to protect the skin so we are able to manage her colostomy appropriately. We will start Imodium to thicken her stool and help with the colostomy care RTC 1-2 weeks

## 2020-04-15 DIAGNOSIS — Z933 Colostomy status: Secondary | ICD-10-CM | POA: Diagnosis not present

## 2020-04-15 DIAGNOSIS — K5732 Diverticulitis of large intestine without perforation or abscess without bleeding: Secondary | ICD-10-CM | POA: Diagnosis not present

## 2020-04-18 DIAGNOSIS — K5720 Diverticulitis of large intestine with perforation and abscess without bleeding: Secondary | ICD-10-CM | POA: Diagnosis not present

## 2020-04-18 DIAGNOSIS — Z933 Colostomy status: Secondary | ICD-10-CM | POA: Diagnosis not present

## 2020-04-18 DIAGNOSIS — K9419 Other complications of enterostomy: Secondary | ICD-10-CM | POA: Diagnosis not present

## 2020-04-18 DIAGNOSIS — K5732 Diverticulitis of large intestine without perforation or abscess without bleeding: Secondary | ICD-10-CM | POA: Diagnosis not present

## 2020-04-20 ENCOUNTER — Ambulatory Visit (INDEPENDENT_AMBULATORY_CARE_PROVIDER_SITE_OTHER): Payer: Medicare HMO | Admitting: Surgery

## 2020-04-20 ENCOUNTER — Encounter: Payer: Self-pay | Admitting: Surgery

## 2020-04-20 ENCOUNTER — Other Ambulatory Visit: Payer: Self-pay

## 2020-04-20 VITALS — BP 144/81 | HR 123 | Temp 98.3°F | Ht 61.0 in | Wt 178.8 lb

## 2020-04-20 DIAGNOSIS — Z09 Encounter for follow-up examination after completed treatment for conditions other than malignant neoplasm: Secondary | ICD-10-CM

## 2020-04-20 NOTE — Progress Notes (Signed)
Status post Hartman's.  Since the last time I change the colostomy there has been significant improvement.  She has also started Imodium great outcomes Still doing daily wet-to-dry and midline wound. She is taking p.o. on her colostomy is working well  PE NAD alert Abd: Midline wound healing well with beefy granulation tissue.  No evidence of infection.  Wet-to-dry was changed.  Left lower quadrant colostomy patent and oriented stool in the back.  A/p doing very well.  Continue current wound care management.  The husband is very engaged and now really understands how to do about this changes. RTC 3 weeks

## 2020-04-20 NOTE — Patient Instructions (Signed)
You may place a plastic bad over the Colostomy bag and remove the dressing from the wound and shower. It is ok to let soapy water run over the wound. When finished with your shower place dress your wound as you have been doing. Try to move around the house more.   Please see your follow up appointment listed below.

## 2020-05-05 DIAGNOSIS — K5732 Diverticulitis of large intestine without perforation or abscess without bleeding: Secondary | ICD-10-CM | POA: Diagnosis not present

## 2020-05-05 DIAGNOSIS — Z933 Colostomy status: Secondary | ICD-10-CM | POA: Diagnosis not present

## 2020-05-16 ENCOUNTER — Encounter: Payer: Self-pay | Admitting: Surgery

## 2020-05-16 ENCOUNTER — Other Ambulatory Visit: Payer: Self-pay

## 2020-05-16 ENCOUNTER — Ambulatory Visit (INDEPENDENT_AMBULATORY_CARE_PROVIDER_SITE_OTHER): Payer: Medicare HMO | Admitting: Surgery

## 2020-05-16 VITALS — BP 117/66 | HR 105 | Temp 98.8°F | Ht 61.0 in | Wt 174.0 lb

## 2020-05-16 DIAGNOSIS — Z09 Encounter for follow-up examination after completed treatment for conditions other than malignant neoplasm: Secondary | ICD-10-CM

## 2020-05-16 NOTE — Patient Instructions (Addendum)
Continue with wound dressing changes daily. Please see your follow up appointment listed below.

## 2020-05-19 NOTE — Progress Notes (Signed)
Status post Hartmann's 1/11. She continues to improve.  She has also started Imodium great outcomes Still doing daily wet-to-dry and midline wound. She is taking p.o. on her colostomy is working well  PE NAD alert Abd: Midline wound healing well with beefy granulation tissue.  No evidence of infection.  Wet-to-dry was changed.  Left lower quadrant colostomy patent and  stool in the back.  A/p doing very well.  Continue current wound care management.   RTC in 6-8 weeks Will need colonoscopy and BE before takedown and would wait at least 6 months from initial surgery before reversal

## 2020-05-31 ENCOUNTER — Ambulatory Visit (INDEPENDENT_AMBULATORY_CARE_PROVIDER_SITE_OTHER): Payer: Medicare HMO | Admitting: Surgery

## 2020-05-31 ENCOUNTER — Telehealth: Payer: Self-pay | Admitting: Surgery

## 2020-05-31 ENCOUNTER — Other Ambulatory Visit: Payer: Self-pay

## 2020-05-31 ENCOUNTER — Encounter: Payer: Self-pay | Admitting: Surgery

## 2020-05-31 VITALS — BP 116/77 | HR 90 | Temp 97.9°F | Ht 61.0 in | Wt 174.0 lb

## 2020-05-31 DIAGNOSIS — Z933 Colostomy status: Secondary | ICD-10-CM

## 2020-05-31 DIAGNOSIS — K5900 Constipation, unspecified: Secondary | ICD-10-CM

## 2020-05-31 DIAGNOSIS — Z9049 Acquired absence of other specified parts of digestive tract: Secondary | ICD-10-CM

## 2020-05-31 DIAGNOSIS — Z48815 Encounter for surgical aftercare following surgery on the digestive system: Secondary | ICD-10-CM

## 2020-05-31 NOTE — Patient Instructions (Signed)
Stop your imodium for a few days. Start Miralax 1 capful in a full glass of water 1-2 times a day until you start having good bowel movements. You may decrease the amount of Miralax as your stools get easier.  You may add fiber to your diet like fresh vegetables and fruit.

## 2020-05-31 NOTE — Telephone Encounter (Signed)
Pt called concerned about increased pain & lack of output through her colostomy over the last 48 hours.  She states that the pain is on the L side of abdomen; L side of stoma & began Sunday evening.  She is not currently taking any medications, including cutting back on her imodium yesterday.  The pain lasts around 5-25 seconds & happens every 5-20 minutes since late Sunday.  On a scale of 1-10, the pain can get up to 7-8, where she has to use lamaze breathing to help get her through.  Libbey is s/p colectomy w/colostomy due to diverticulitis w/Dr. Everlene Farrier (03/29/20) & was not sure if she could have another attack of diverticulitis after the surgery or what might be causing these issues.  She has been doing so well up to now;  Really getting the

## 2020-05-31 NOTE — Telephone Encounter (Signed)
Spoke with Dr Everlene Farrier and he will see the patient today in office.

## 2020-05-31 NOTE — Progress Notes (Signed)
Outpatient Surgical Follow Up  05/31/2020  Monica Mckenzie is an 65 y.o. female.   Chief Complaint  Patient presents with  . Routine Post Op    HPI: status post Hartmann's 1/11. Comes w constipation. She has decrease in ostomy output. No vomiting. Had breakfast this am. Now w pain RLQ. Intermittent and moderate in intensity Still doing daily wet-to-dry and midline wound. She is taking p.o. on her colostomy is working well  Past Medical History:  Diagnosis Date  . Allergy   . Asthma     Past Surgical History:  Procedure Laterality Date  . COLECTOMY WITH COLOSTOMY CREATION/HARTMANN PROCEDURE N/A 03/29/2020   Procedure: COLECTOMY WITH COLOSTOMY CREATION/HARTMANN PROCEDURE;  Surgeon: Leafy Ro, MD;  Location: [facility] ORS;  Service: General;  Laterality: N/A;  . COLONOSCOPY  2012   cleared for 10 yrs- Dr Bluford Kaufmann    Family History  Problem Relation Age of Onset  . Cancer Mother   . Breast cancer Mother 41  . Diabetes Father   . Hypertension Father     Social History:  reports that she has quit smoking. She has never used smokeless tobacco. She reports current alcohol use. She reports that she does not use drugs.  Allergies: No Known Allergies  Medications reviewed.    ROS Full ROS performed and is otherwise negative other than what is stated in HPI   BP 116/77   Pulse 90   Temp 97.9 F (36.6 C)   Ht 5\' 1"  (1.549 m)   Wt 174 lb (78.9 kg)   SpO2 97%   BMI 32.88 kg/m   Physical Exam Vitals and nursing note reviewed. Exam conducted with a chaperone present.  Constitutional:      General: She is not in acute distress.    Appearance: Normal appearance. She is obese.  Eyes:     General: No scleral icterus.       Right eye: No discharge.        Left eye: No discharge.  Abdominal:     General: There is no distension.     Palpations: Abdomen is soft. There is no mass.     Tenderness: There is no abdominal tenderness.     Hernia: No hernia is present.     Comments:  Left lower quadrant colostomy patent, retracted,  mucosa viable and seen. digitalized, stool ball encounter,I broke some of it with my finger. Did tap water enema via red rubber.hard stool removed.She is not peritonitis    Musculoskeletal:        General: Normal range of motion.     Cervical back: Normal range of motion and neck supple. No rigidity or tenderness.  Skin:    General: Skin is warm and dry.     Capillary Refill: Capillary refill takes less than 2 seconds.  Neurological:     General: No focal deficit present.     Mental Status: She is alert and oriented to person, place, and time.  Psychiatric:        Mood and Affect: Mood normal.        Behavior: Behavior normal.        Thought Content: Thought content normal.        Judgment: Judgment normal.      Assessment/Plan:  A/pConstipation  w impaction D/w the pt about miralax and about decreasing imodium No need for intervention or admission at this time Keep f/u appt. Please note that this  problem is unrelated to her surgery  Greater than 50% of the 25 minutes  visit was spent in counseling/coordination of care   Sterling Big, MD [facility] General Surgeon

## 2020-05-31 NOTE — Telephone Encounter (Signed)
**   System glitch during documentation & unable to complete original message...  Monica Mckenzie is s/p colectomy w/colostomy due to diverticulitis w/Dr. Everlene Farrier (03/29/20) & was not sure if she could have another attack of diverticulitis after the surgery or what might be causing these issues.  She has been doing so well up to now "really getting the hang of her body & the ostomy", so she is not sure where the pain is coming from.  Nikka is aware that Dr. Everlene Farrier is in the OR today, however message will be routed through the clinical team for his review & response.  Thank you

## 2020-06-02 DIAGNOSIS — Z933 Colostomy status: Secondary | ICD-10-CM | POA: Diagnosis not present

## 2020-06-02 DIAGNOSIS — K5732 Diverticulitis of large intestine without perforation or abscess without bleeding: Secondary | ICD-10-CM | POA: Diagnosis not present

## 2020-06-09 ENCOUNTER — Telehealth: Payer: Self-pay | Admitting: Surgery

## 2020-06-09 NOTE — Telephone Encounter (Signed)
Spoke with patient and let her know that occasional mucus discharge from her rectum is normal. The body still produces mucus and the lining cells still with slough off. She was very relieved to hear this. The blood could be irritation from the discharge or possible hemorrhoids. She reports it was only a little bit on the toilet paper. She will monitor this and call with any changes. She reports she is passing stool through her colostomy and is not having any problems with this. She denies any pain, fever, or chills. She will call with any further questions.

## 2020-06-09 NOTE — Telephone Encounter (Signed)
Pt called requesting a c/b from CK, LPN.  She is not sure what is going on w/her (s/p colostomy w/Dr. Everlene Farrier on 03/29/20) but she thinks something is wrong & is asking to spk w/her directly.  She states she felt a lot of pressure in the bowel area & didn't think anything of it until she got up to go to the restroom around 4 am & passed a light tan/mostly clear, slightly thick but not "stool thick" liquid more mucus-like in texture) from her rectum & when she went to clean herself there was a little bit of bright red blood.  Does this mean there has been a tear?  Does/should  this happen from time to time?  So far, Monica Mckenzie has not had anything to eat/drink this morning & is not in any pain.    Please advise.  Thank you

## 2020-06-09 NOTE — Telephone Encounter (Deleted)
Pt called requesting a c/b from CK, LPN.  She is not sure what is going on w/her (s/p colostomy w/Dr. Everlene Farrier on 03/29/20) but she thinks something is wrong & is asking to spk w/her directly.  She states she felt a lot of pressure in the bowel area & didn't think anything of it until she got up to go to the restroom around 4 am & passed a light tan/mostly clear, slightly thick but not "stool thick" (more mucus-like in texture) from her rectum & when she went to clean herself there was a little bit of bright red blood.  Does this mean there has been a tear?  Does/should  this happen from time to time?  So far, Monica Mckenzie has not had anything to eat/drink this morning & is not in any pain.    Please advise.  Thank you

## 2020-06-29 ENCOUNTER — Encounter: Payer: Self-pay | Admitting: Surgery

## 2020-06-29 ENCOUNTER — Other Ambulatory Visit: Payer: Self-pay

## 2020-06-29 ENCOUNTER — Ambulatory Visit: Payer: Medicare HMO | Admitting: Surgery

## 2020-06-29 VITALS — BP 141/79 | HR 90 | Temp 98.9°F | Ht 61.0 in | Wt 169.0 lb

## 2020-06-29 DIAGNOSIS — K5900 Constipation, unspecified: Secondary | ICD-10-CM | POA: Diagnosis not present

## 2020-06-29 DIAGNOSIS — Z1211 Encounter for screening for malignant neoplasm of colon: Secondary | ICD-10-CM | POA: Diagnosis not present

## 2020-06-29 NOTE — Patient Instructions (Signed)
Continue taking Imodium, begin taking 2 daily along with Miralax. A referral to GI has been placed for a colonoscopy. They will call you to make an appointment. If you have any concerns or questions, please feel free to call our clinic.    High-Fiber Eating Plan Fiber, also called dietary fiber, is a type of carbohydrate. It is found foods such as fruits, vegetables, whole grains, and beans. A high-fiber diet can have many health benefits. Your health care provider may recommend a high-fiber diet to help:  Prevent constipation. Fiber can make your bowel movements more regular.  Lower your cholesterol.  Relieve the following conditions: ? Inflammation of veins in the anus (hemorrhoids). ? Inflammation of specific areas of the digestive tract (uncomplicated diverticulosis). ? A problem of the large intestine, also called the colon, that sometimes causes pain and diarrhea (irritable bowel syndrome, or IBS).  Prevent overeating as part of a weight-loss plan.  Prevent heart disease, type 2 diabetes, and certain cancers. What are tips for following this plan? Reading food labels  Check the nutrition facts label on food products for the amount of dietary fiber. Choose foods that have 5 grams of fiber or more per serving.  The goals for recommended daily fiber intake include: ? Men (age 52 or younger): 34-38 g. ? Men (over age 110): 28-34 g. ? Women (age 19 or younger): 25-28 g. ? Women (over age 5): 22-25 g. Your daily fiber goal is _____________ g.   Shopping  Choose whole fruits and vegetables instead of processed forms, such as apple juice or applesauce.  Choose a wide variety of high-fiber foods such as avocados, lentils, oats, and kidney beans.  Read the nutrition facts label of the foods you choose. Be aware of foods with added fiber. These foods often have high sugar and sodium amounts per serving. Cooking  Use whole-grain flour for baking and cooking.  Cook with brown rice  instead of white rice. Meal planning  Start the day with a breakfast that is high in fiber, such as a cereal that contains 5 g of fiber or more per serving.  Eat breads and cereals that are made with whole-grain flour instead of refined flour or white flour.  Eat brown rice, bulgur wheat, or millet instead of white rice.  Use beans in place of meat in soups, salads, and pasta dishes.  Be sure that half of the grains you eat each day are whole grains. General information  You can get the recommended daily intake of dietary fiber by: ? Eating a variety of fruits, vegetables, grains, nuts, and beans. ? Taking a fiber supplement if you are not able to take in enough fiber in your diet. It is better to get fiber through food than from a supplement.  Gradually increase how much fiber you consume. If you increase your intake of dietary fiber too quickly, you may have bloating, cramping, or gas.  Drink plenty of water to help you digest fiber.  Choose high-fiber snacks, such as berries, raw vegetables, nuts, and popcorn. What foods should I eat? Fruits Berries. Pears. Apples. Oranges. Avocado. Prunes and raisins. Dried figs. Vegetables Sweet potatoes. Spinach. Kale. Artichokes. Cabbage. Broccoli. Cauliflower. Green peas. Carrots. Squash. Grains Whole-grain breads. Multigrain cereal. Oats and oatmeal. Brown rice. Barley. Bulgur wheat. Millet. Quinoa. Bran muffins. Popcorn. Rye wafer crackers. Meats and other proteins Navy beans, kidney beans, and pinto beans. Soybeans. Split peas. Lentils. Nuts and seeds. Dairy Fiber-fortified yogurt. Beverages Fiber-fortified soy milk. Fiber-fortified orange  juice. Other foods Fiber bars. The items listed above may not be a complete list of recommended foods and beverages. Contact a dietitian for more information. What foods should I avoid? Fruits Fruit juice. Cooked, strained fruit. Vegetables Fried potatoes. Canned vegetables. Well-cooked  vegetables. Grains White bread. Pasta made with refined flour. White rice. Meats and other proteins Fatty cuts of meat. Fried chicken or fried fish. Dairy Milk. Yogurt. Cream cheese. Sour cream. Fats and oils Butters. Beverages Soft drinks. Other foods Cakes and pastries. The items listed above may not be a complete list of foods and beverages to avoid. Talk with your dietitian about what choices are best for you. Summary  Fiber is a type of carbohydrate. It is found in foods such as fruits, vegetables, whole grains, and beans.  A high-fiber diet has many benefits. It can help to prevent constipation, lower blood cholesterol, aid weight loss, and reduce your risk of heart disease, diabetes, and certain cancers.  Increase your intake of fiber gradually. Increasing fiber too quickly may cause cramping, bloating, and gas. Drink plenty of water while you increase the amount of fiber you consume.  The best sources of fiber include whole fruits and vegetables, whole grains, nuts, seeds, and beans. This information is not intended to replace advice given to you by your health care provider. Make sure you discuss any questions you have with your health care provider. Document Revised: 07/09/2019 Document Reviewed: 07/09/2019 Elsevier Patient Education  2021 [employer].

## 2020-06-29 NOTE — Progress Notes (Signed)
Outpatient Surgical Follow Up  06/29/2020  Monica Mckenzie is an 65 y.o. female.   CC: colostomy discussion  HPI: status post Hartmann's1/11. She comes for follow-up.  He has been alternating constipation and quit stool.  Her wound is healing well.  No fevers no chills minimal abdominal pain..Still doing daily wet-to-dry and midline wound. She is taking p.o. on her colostomy is working well. She has returned to all activities of daily living  Past Medical History:  Diagnosis Date  . Allergy   . Asthma     Past Surgical History:  Procedure Laterality Date  . COLECTOMY WITH COLOSTOMY CREATION/HARTMANN PROCEDURE N/A 03/29/2020   Procedure: COLECTOMY WITH COLOSTOMY CREATION/HARTMANN PROCEDURE;  Surgeon: Leafy Ro, MD;  Location: [facility] ORS;  Service: General;  Laterality: N/A;  . COLONOSCOPY  2012   cleared for 10 yrs- Dr Bluford Kaufmann    Family History  Problem Relation Age of Onset  . Cancer Mother   . Breast cancer Mother 77  . Diabetes Father   . Hypertension Father     Social History:  reports that she has quit smoking. She has never used smokeless tobacco. She reports current alcohol use. She reports that she does not use drugs.  Allergies: No Known Allergies  Medications reviewed.    ROS Full ROS performed and is otherwise negative other than what is stated in HPI   BP (!) 141/79   Pulse 90   Temp 98.9 F (37.2 C)   Ht 5\' 1"  (1.549 m)   Wt 169 lb (76.7 kg)   SpO2 98%   BMI 31.93 kg/m   Physical Exam Vitals and nursing note reviewed. Exam conducted with a chaperone present.  Constitutional:      General: She is not in acute distress.    Appearance: Normal appearance. She is normal weight.  Cardiovascular:     Rate and Rhythm: Normal rate and regular rhythm.     Heart sounds: No murmur heard.   Pulmonary:     Effort: Pulmonary effort is normal. No respiratory distress.     Breath sounds: Normal breath sounds. No stridor.  Abdominal:     General: Abdomen is  flat. There is no distension.     Palpations: Abdomen is soft. There is no mass.     Tenderness: There is no abdominal tenderness. There is no guarding or rebound.     Hernia: No hernia is present.     Comments: Colostomy in place and working,  Midline wound healing well w/o infection  Musculoskeletal:     Cervical back: Normal range of motion and neck supple. No rigidity or tenderness.  Skin:    General: Skin is warm and dry.     Capillary Refill: Capillary refill takes less than 2 seconds.  Neurological:     General: No focal deficit present.     Mental Status: She is alert and oriented to person, place, and time.  Psychiatric:        Mood and Affect: Mood normal.        Behavior: Behavior normal.        Thought Content: Thought content normal.        Judgment: Judgment normal.      Assessment/Plan: 65 year old female status post Hartman's procedure for perforated diverticulitis.  Asked to reduce Imodium as this is causing some liquid stool.  We will go ahead and arrange for colonoscopy via colostomy and we will also obtain barium enema via colostomy and via rectum.  We  will follow with her in about 4 to 6 weeks and at that time we will schedule her colostomy reversal.  I had an extensive discussion today with the patient regarding the need for colonoscopy and further testing before colostomy takedown.  I also discussed with her the operative course of a colostomy takedown.  She understands and wishes to proceed.   Greater than 50% of the 42 minutes  visit was spent in counseling/coordination of care   Sterling Big, MD [facility] General Surgeon

## 2020-06-30 ENCOUNTER — Encounter: Payer: Self-pay | Admitting: *Deleted

## 2020-07-04 ENCOUNTER — Encounter: Payer: Self-pay | Admitting: Gastroenterology

## 2020-07-04 ENCOUNTER — Ambulatory Visit (INDEPENDENT_AMBULATORY_CARE_PROVIDER_SITE_OTHER): Payer: Medicare HMO | Admitting: Gastroenterology

## 2020-07-04 ENCOUNTER — Other Ambulatory Visit: Payer: Self-pay

## 2020-07-04 VITALS — BP 119/76 | HR 91 | Temp 97.7°F | Ht 61.0 in | Wt 172.4 lb

## 2020-07-04 DIAGNOSIS — K5732 Diverticulitis of large intestine without perforation or abscess without bleeding: Secondary | ICD-10-CM

## 2020-07-04 MED ORDER — SUPREP BOWEL PREP KIT 17.5-3.13-1.6 GM/177ML PO SOLN: 1.0000 | ORAL | 0 refills | Status: DC

## 2020-07-04 NOTE — H&P (View-Only) (Signed)
  Gastroenterology Consultation  Referring Provider:     Babaoff, Marcus, MD Primary Care Physician:  Babaoff, Marcus, MD Primary Gastroenterologist:  Dr. Rhonin Trott     Reason for Consultation:     Diverticulitis        HPI:   Monica Mckenzie is a 65 y.o. y/o female referred for consultation & management of diverticulitis by Dr. Babaoff, Marcus, MD.  This patient comes in after being seen in the hospital in January of this year by surgery for a perforated colon from diverticulitis.  The patient underwent a colostomy with a Hartmann pouch procedure. The patient reports that she was sent to me since she has not had a colonoscopy and 11 or 12 years and was told that she needed a colonoscopy.  She was also under the impression that she may need a barium enema to look at the lower part of her remaining colon.  She denies any fevers chills nausea vomiting black stools or bloody stools.  Past Medical History:  Diagnosis Date  . Allergy   . Asthma     Past Surgical History:  Procedure Laterality Date  . COLECTOMY WITH COLOSTOMY CREATION/HARTMANN PROCEDURE N/A 03/29/2020   Procedure: COLECTOMY WITH COLOSTOMY CREATION/HARTMANN PROCEDURE;  Surgeon: Pabon, Diego F, MD;  Location: [facility] ORS;  Service: General;  Laterality: N/A;  . COLONOSCOPY  2012   cleared for 10 yrs- Dr Oh    Prior to Admission medications   Medication Sig Start Date End Date Taking? Authorizing Provider  ALPRAZolam (XANAX) 0.5 MG tablet Take 1 tablet (0.5 mg total) by mouth 2 (two) times daily as needed for anxiety. 04/13/20   Pabon, Diego F, MD  ibuprofen (ADVIL) 600 MG tablet Take 1 tablet (600 mg total) by mouth every 6 (six) hours as needed for mild pain, moderate pain, fever or headache. 04/05/20   Schulz, Zachary R, PA-C  loperamide (IMODIUM A-D) 2 MG tablet Take 2 tablets (4 mg total) by mouth 4 (four) times daily as needed for diarrhea or loose stools. 04/13/20   Pabon, Diego F, MD  Multiple Vitamins-Minerals (CENTRUM SILVER  ULTRA WOMENS PO) Take 1 tablet by mouth daily.    [provider]  Phenazopyridine HCl (AZO TABS PO) Take 2 capsules by mouth daily.    [provider]  polyethylene glycol (MIRALAX / GLYCOLAX) 17 g packet Take 17 g by mouth daily.    [provider]    Family History  Problem Relation Age of Onset  . Cancer Mother   . Breast cancer Mother 70  . Diabetes Father   . Hypertension Father      Social History   Tobacco Use  . Smoking status: Former Smoker  . Smokeless tobacco: Never Used  Substance Use Topics  . Alcohol use: Yes    Alcohol/week: 0.0 standard drinks  . Drug use: No    Allergies as of 07/04/2020  . (No Known Allergies)    Review of Systems:    All systems reviewed and negative except where noted in HPI.   Physical Exam:  There were no vitals taken for this visit. No LMP recorded. Patient is postmenopausal. General:   Alert,  Well-developed, well-nourished, pleasant and cooperative in NAD Head:  Normocephalic and atraumatic. Eyes:  Sclera clear, no icterus.   Conjunctiva pink. Ears:  Normal auditory acuity. Neck:  Supple; no masses or thyromegaly. Lungs:  Respirations even and unlabored.  Clear throughout to auscultation.   No wheezes, crackles, or rhonchi. No acute distress.   Heart:  Regular rate and rhythm; no murmurs, clicks, rubs, or gallops. Abdomen:  Normal bowel sounds.  No bruits.  Soft, non-tender and non-distended without masses, hepatosplenomegaly or hernias noted.  No guarding or rebound tenderness.  Negative Carnett sign.   Rectal:  Deferred.  Pulses:  Normal pulses noted. Extremities:  No clubbing or edema.  No cyanosis. Neurologic:  Alert and oriented x3;  grossly normal neurologically. Skin:  Intact without significant lesions or rashes.  No jaundice. Lymph Nodes:  No significant cervical adenopathy. Psych:  Alert and cooperative. Normal mood and affect.  Imaging Studies: No results found.  Assessment and Plan:    Monica Mckenzie is a 65 y.o. y/o female who comes in today with a history of diverticulitis with a perforation and a colostomy with a Hartman's pouch. Prior to having reversal of the surgery the patient is being sent to me for a colonoscopy.  I sent message to her surgeon to see if we can just do the colonoscopy through the ostomy site and look in the lower part of the colon through the rectum to  avoid the patient from having to undergo a barium enema. I have explained the plan to the patient and will await the response from the surgeon.  The patient has been explained the plan and agrees with it.    Midge Minium, MD. Clementeen Graham    Note: This dictation was prepared with Dragon dictation along with smaller phrase technology. Any transcriptional errors that result from this process are unintentional.

## 2020-07-04 NOTE — Progress Notes (Signed)
Gastroenterology Consultation  Referring Provider:     Kandyce Rud, MD Primary Care Physician:  Kandyce Rud, MD Primary Gastroenterologist:  Dr. Servando Snare     Reason for Consultation:     Diverticulitis        HPI:   Monica Mckenzie is a 65 y.o. y/o female referred for consultation & management of diverticulitis by Dr. Kandyce Rud, MD.  This patient comes in after being seen in the hospital in January of this year by surgery for a perforated colon from diverticulitis.  The patient underwent a colostomy with a Hartmann pouch procedure. The patient reports that she was sent to me since she has not had a colonoscopy and 11 or 12 years and was told that she needed a colonoscopy.  She was also under the impression that she may need a barium enema to look at the lower part of her remaining colon.  She denies any fevers chills nausea vomiting black stools or bloody stools.  Past Medical History:  Diagnosis Date  . Allergy   . Asthma     Past Surgical History:  Procedure Laterality Date  . COLECTOMY WITH COLOSTOMY CREATION/HARTMANN PROCEDURE N/A 03/29/2020   Procedure: COLECTOMY WITH COLOSTOMY CREATION/HARTMANN PROCEDURE;  Surgeon: Leafy Ro, MD;  Location: [facility] ORS;  Service: General;  Laterality: N/A;  . COLONOSCOPY  2012   cleared for 10 yrs- Dr Bluford Kaufmann    Prior to Admission medications   Medication Sig Start Date End Date Taking? Authorizing Provider  ALPRAZolam Prudy Feeler) 0.5 MG tablet Take 1 tablet (0.5 mg total) by mouth 2 (two) times daily as needed for anxiety. 04/13/20   Pabon, Merri Ray, MD  ibuprofen (ADVIL) 600 MG tablet Take 1 tablet (600 mg total) by mouth every 6 (six) hours as needed for mild pain, moderate pain, fever or headache. 04/05/20   Donovan Kail, PA-C  loperamide (IMODIUM A-D) 2 MG tablet Take 2 tablets (4 mg total) by mouth 4 (four) times daily as needed for diarrhea or loose stools. 04/13/20   Pabon, Merri Ray, MD  Multiple Vitamins-Minerals (CENTRUM SILVER  ULTRA WOMENS PO) Take 1 tablet by mouth daily.    [provider]  Phenazopyridine HCl (AZO TABS PO) Take 2 capsules by mouth daily.    [provider]  polyethylene glycol (MIRALAX / GLYCOLAX) 17 g packet Take 17 g by mouth daily.    [provider]    Family History  Problem Relation Age of Onset  . Cancer Mother   . Breast cancer Mother 26  . Diabetes Father   . Hypertension Father      Social History   Tobacco Use  . Smoking status: Former Games developer  . Smokeless tobacco: Never Used  Substance Use Topics  . Alcohol use: Yes    Alcohol/week: 0.0 standard drinks  . Drug use: No    Allergies as of 07/04/2020  . (No Known Allergies)    Review of Systems:    All systems reviewed and negative except where noted in HPI.   Physical Exam:  There were no vitals taken for this visit. No LMP recorded. Patient is postmenopausal. General:   Alert,  Well-developed, well-nourished, pleasant and cooperative in NAD Head:  Normocephalic and atraumatic. Eyes:  Sclera clear, no icterus.   Conjunctiva pink. Ears:  Normal auditory acuity. Neck:  Supple; no masses or thyromegaly. Lungs:  Respirations even and unlabored.  Clear throughout to auscultation.   No wheezes, crackles, or rhonchi. No acute distress.  Heart:  Regular rate and rhythm; no murmurs, clicks, rubs, or gallops. Abdomen:  Normal bowel sounds.  No bruits.  Soft, non-tender and non-distended without masses, hepatosplenomegaly or hernias noted.  No guarding or rebound tenderness.  Negative Carnett sign.   Rectal:  Deferred.  Pulses:  Normal pulses noted. Extremities:  No clubbing or edema.  No cyanosis. Neurologic:  Alert and oriented x3;  grossly normal neurologically. Skin:  Intact without significant lesions or rashes.  No jaundice. Lymph Nodes:  No significant cervical adenopathy. Psych:  Alert and cooperative. Normal mood and affect.  Imaging Studies: No results found.  Assessment and Plan:    Monica Mckenzie is a 65 y.o. y/o female who comes in today with a history of diverticulitis with a perforation and a colostomy with a Hartman's pouch. Prior to having reversal of the surgery the patient is being sent to me for a colonoscopy.  I sent message to her surgeon to see if we can just do the colonoscopy through the ostomy site and look in the lower part of the colon through the rectum to  avoid the patient from having to undergo a barium enema. I have explained the plan to the patient and will await the response from the surgeon.  The patient has been explained the plan and agrees with it.    Midge Minium, MD. Clementeen Graham    Note: This dictation was prepared with Dragon dictation along with smaller phrase technology. Any transcriptional errors that result from this process are unintentional.

## 2020-07-05 ENCOUNTER — Other Ambulatory Visit: Payer: Self-pay

## 2020-07-05 DIAGNOSIS — K5732 Diverticulitis of large intestine without perforation or abscess without bleeding: Secondary | ICD-10-CM

## 2020-07-05 DIAGNOSIS — Z933 Colostomy status: Secondary | ICD-10-CM | POA: Diagnosis not present

## 2020-07-12 ENCOUNTER — Encounter
Admission: RE | Disposition: A | Discharge status: Home / Self Care | Payer: Self-pay | Source: Home / Self Care | Attending: Gastroenterology

## 2020-07-12 ENCOUNTER — Other Ambulatory Visit: Payer: Self-pay

## 2020-07-12 ENCOUNTER — Ambulatory Visit: Payer: Medicare HMO | Admitting: Certified Registered"

## 2020-07-12 ENCOUNTER — Ambulatory Visit
Admission: RE | Admit: 2020-07-12 | Discharge: 2020-07-12 | Disposition: A | Discharge status: Home / Self Care | Payer: Medicare HMO | Attending: Gastroenterology | Admitting: Gastroenterology

## 2020-07-12 ENCOUNTER — Encounter: Payer: Self-pay | Admitting: Gastroenterology

## 2020-07-12 DIAGNOSIS — Z87891 Personal history of nicotine dependence: Secondary | ICD-10-CM | POA: Diagnosis not present

## 2020-07-12 DIAGNOSIS — Z8719 Personal history of other diseases of the digestive system: Secondary | ICD-10-CM | POA: Insufficient documentation

## 2020-07-12 DIAGNOSIS — K5732 Diverticulitis of large intestine without perforation or abscess without bleeding: Secondary | ICD-10-CM

## 2020-07-12 DIAGNOSIS — K635 Polyp of colon: Secondary | ICD-10-CM | POA: Insufficient documentation

## 2020-07-12 DIAGNOSIS — K5730 Diverticulosis of large intestine without perforation or abscess without bleeding: Secondary | ICD-10-CM | POA: Insufficient documentation

## 2020-07-12 DIAGNOSIS — K529 Noninfective gastroenteritis and colitis, unspecified: Secondary | ICD-10-CM | POA: Insufficient documentation

## 2020-07-12 DIAGNOSIS — Z09 Encounter for follow-up examination after completed treatment for conditions other than malignant neoplasm: Secondary | ICD-10-CM | POA: Diagnosis not present

## 2020-07-12 DIAGNOSIS — Z9049 Acquired absence of other specified parts of digestive tract: Secondary | ICD-10-CM | POA: Diagnosis not present

## 2020-07-12 DIAGNOSIS — Z933 Colostomy status: Secondary | ICD-10-CM | POA: Diagnosis not present

## 2020-07-12 HISTORY — PX: COLONOSCOPY WITH PROPOFOL: SHX5780

## 2020-07-12 SURGERY — COLONOSCOPY WITH PROPOFOL
Anesthesia: General

## 2020-07-12 MED ORDER — PROPOFOL 500 MG/50ML IV EMUL
INTRAVENOUS | Status: DC | PRN
  Administered 2020-07-12: 150 ug/kg/min via INTRAVENOUS

## 2020-07-12 MED ORDER — PROPOFOL 10 MG/ML IV BOLUS
INTRAVENOUS | Status: DC | PRN
  Administered 2020-07-12: 80 mg via INTRAVENOUS

## 2020-07-12 MED ORDER — SODIUM CHLORIDE 0.9 % IV SOLN: INTRAVENOUS | Status: DC

## 2020-07-12 NOTE — Anesthesia Preprocedure Evaluation (Signed)
Anesthesia Evaluation  Patient identified by MRN, date of birth, ID band Patient awake    Reviewed: Allergy & Precautions, H&P , NPO status , Patient's Chart, lab work & pertinent test results, reviewed documented beta blocker date and time   Airway Mallampati: II   Neck ROM: full    Dental  (+) Teeth Intact   Pulmonary asthma , former smoker,    Pulmonary exam normal        Cardiovascular Exercise Tolerance: Good negative cardio ROS Normal cardiovascular exam Rhythm:regular Rate:Normal     Neuro/Psych negative neurological ROS  negative psych ROS   GI/Hepatic negative GI ROS, Neg liver ROS,   Endo/Other  negative endocrine ROS  Renal/GU negative Renal ROS  negative genitourinary   Musculoskeletal   Abdominal   Peds  Hematology negative hematology ROS (+)   Anesthesia Other Findings Past Medical History: No date: Allergy No date: Asthma Past Surgical History: 03/29/2020: COLECTOMY WITH COLOSTOMY CREATION/HARTMANN PROCEDURE; N/A     Comment:  Procedure: COLECTOMY WITH COLOSTOMY CREATION/HARTMANN               PROCEDURE;  Surgeon: Leafy Ro, MD;  Location: [facility]               ORS;  Service: General;  Laterality: N/A; 2012: COLONOSCOPY     Comment:  cleared for 10 yrs- Dr Bluford Kaufmann BMI    Body Mass Index: 31.55 kg/m     Reproductive/Obstetrics negative OB ROS                             Anesthesia Physical Anesthesia Plan  ASA: II  Anesthesia Plan: General   Post-op Pain Management:    Induction:   PONV Risk Score and Plan:   Airway Management Planned:   Additional Equipment:   Intra-op Plan:   Post-operative Plan:   Informed Consent: I have reviewed the patients History and Physical, chart, labs and discussed the procedure including the risks, benefits and alternatives for the proposed anesthesia with the patient or authorized representative who has indicated his/her  understanding and acceptance.     Dental Advisory Given  Plan Discussed with: CRNA  Anesthesia Plan Comments:         Anesthesia Quick Evaluation

## 2020-07-12 NOTE — Anesthesia Postprocedure Evaluation (Signed)
Anesthesia Post Note  Patient: Maeryn Mcgath Bieler  Procedure(s) Performed: COLONOSCOPY WITH PROPOFOL THROUGH COLOSTOMY (N/A )  Patient location during evaluation: PACU Anesthesia Type: General Level of consciousness: awake and alert Pain management: pain level controlled Vital Signs Assessment: post-procedure vital signs reviewed and stable Respiratory status: spontaneous breathing, nonlabored ventilation, respiratory function stable and patient connected to nasal cannula oxygen Cardiovascular status: blood pressure returned to baseline and stable Postop Assessment: no apparent nausea or vomiting Anesthetic complications: no   No complications documented.   Last Vitals:  Vitals:   07/12/20 0932 07/12/20 0940  BP: 104/68 111/68  Pulse: (!) 105 90  Resp: 16 17  Temp: 36.8 C   SpO2: 98% 98%    Last Pain:  Vitals:   07/12/20 0940  TempSrc:   PainSc: 0-No pain                 Yevette Edwards

## 2020-07-12 NOTE — Interval H&P Note (Signed)
Monica Lame, MD [address] [place], [place] [address] Phone:[phone] Fax : [phone]  Primary Care Physician:  Derinda Late, MD Primary Gastroenterologist:  Dr. Allen Norris  Pre-Procedure History & Physical: HPI:  Monica Mckenzie is a 65 y.o. female is here for an colonoscopy.   Past Medical History:  Diagnosis Date   Allergy    Asthma     Past Surgical History:  Procedure Laterality Date   COLECTOMY WITH COLOSTOMY CREATION/HARTMANN PROCEDURE N/A 03/29/2020   Procedure: COLECTOMY WITH COLOSTOMY CREATION/HARTMANN PROCEDURE;  Surgeon: Jules Husbands, MD;  Location: [facility] ORS;  Service: General;  Laterality: N/A;   COLONOSCOPY  2012   cleared for 10 yrs- Dr Candace Cruise    Prior to Admission medications   Medication Sig Start Date End Date Taking? Authorizing Provider  ibuprofen (ADVIL) 600 MG tablet Take 1 tablet (600 mg total) by mouth every 6 (six) hours as needed for mild pain, moderate pain, fever or headache. 04/05/20  Yes Tylene Fantasia, PA-C  Multiple Vitamins-Minerals (CENTRUM SILVER ULTRA WOMENS PO) Take 1 tablet by mouth daily.   Yes [provider]  Na Sulfate-K Sulfate-Mg Sulf (SUPREP BOWEL PREP KIT) 17.5-3.13-1.6 GM/177ML SOLN Take 1 kit by mouth as directed. 07/04/20  Yes Monica Lame, MD  ALPRAZolam Duanne Moron) 0.5 MG tablet Take 1 tablet (0.5 mg total) by mouth 2 (two) times daily as needed for anxiety. Patient not taking: Reported on 07/12/2020 04/13/20   Caroleen Hamman F, MD  loperamide (IMODIUM A-D) 2 MG tablet Take 2 tablets (4 mg total) by mouth 4 (four) times daily as needed for diarrhea or loose stools. 04/13/20   Pabon, Diego F, MD  Phenazopyridine HCl (AZO TABS PO) Take 2 capsules by mouth daily.    [provider]  polyethylene glycol (MIRALAX / GLYCOLAX) 17 g packet Take 17 g by mouth daily.    [provider]    Allergies as of 07/06/2020   (No Known Allergies)    Family History  Problem Relation Age of Onset   Cancer  Mother    Breast cancer Mother 61   Diabetes Father    Hypertension Father     Social History   Socioeconomic History   Marital status: Married    Spouse name: Not on file   Number of children: Not on file   Years of education: Not on file   Highest education level: Not on file  Occupational History   Not on file  Tobacco Use   Smoking status: Former Smoker   Smokeless tobacco: Never Used  Vaping Use   Vaping Use: Never used  Substance and Sexual Activity   Alcohol use: Yes    Alcohol/week: 0.0 standard drinks   Drug use: No   Sexual activity: Yes  Other Topics Concern   Not on file  Social History Narrative   Not on file   Social Determinants of Health   Financial Resource Strain: Not on file  Food Insecurity: Not on file  Transportation Needs: Not on file  Physical Activity: Not on file  Stress: Not on file  Social Connections: Not on file  Intimate Partner Violence: Not on file    Review of Systems: See HPI, otherwise negative ROS  Physical Exam: BP 133/80   Pulse 93   Temp (!) 97.3 F (36.3 C)   Resp 18   Ht 5' 1"  (1.549 m)   Wt 75.8 kg   SpO2 100%   BMI 31.55 kg/m  General:  Alert,  pleasant and cooperative in NAD Head:  Normocephalic and atraumatic. Neck:  Supple; no masses or thyromegaly. Lungs:  Clear throughout to auscultation.    Heart:  Regular rate and rhythm. Abdomen:  Soft, nontender and nondistended. Normal bowel sounds, without guarding, and without rebound.   Neurologic:  Alert and  oriented x4;  grossly normal neurologically.  Impression/Plan: Makina Skow Crehan is here for an colonoscopy to be performed for diverticuitiis  Risks, benefits, limitations, and alternatives regarding  colonoscopy have been reviewed with the patient.  Questions have been answered.  All parties agreeable.   Monica Lame, MD  07/12/2020, 9:06 AM

## 2020-07-12 NOTE — Op Note (Signed)
[facility] Gastroenterology Patient Name: Monica Mckenzie Procedure Date: 07/12/2020 9:04 AM MRN: [id number] Account #: [id number] Date of Birth: 06/30/1955 Admit Type: Outpatient Age: 65 Room: [facility] ENDO ROOM 4 Gender: Female Note Status: Finalized Procedure:             Colonoscopy Indications:           Follow-up of diverticulitis Providers:             Midge Minium MD, MD Referring MD:          Hassell Halim MD (Referring MD) Medicines:             Propofol per Anesthesia Complications:         No immediate complications. Procedure:             Pre-Anesthesia Assessment:                        - Prior to the procedure, a History and Physical was                         performed, and patient medications and allergies were                         reviewed. The patient's tolerance of previous                         anesthesia was also reviewed. The risks and benefits                         of the procedure and the sedation options and risks                         were discussed with the patient. All questions were                         answered, and informed consent was obtained. Prior                         Anticoagulants: The patient has taken no previous                         anticoagulant or antiplatelet agents. ASA Grade                         Assessment: II - A patient with mild systemic disease.                         After reviewing the risks and benefits, the patient                         was deemed in satisfactory condition to undergo the                         procedure.                        After obtaining informed consent, the colonoscope was  passed under direct vision. Throughout the procedure,                         the patient's blood pressure, pulse, and oxygen                         saturations were monitored continuously. The                         Colonoscope was introduced through the descending                          colostomy and advanced to the the cecum, identified by                         appendiceal orifice and ileocecal valve. The                         colonoscopy was performed without difficulty. The                         patient tolerated the procedure well. The quality of                         the bowel preparation was excellent. Findings:      The perianal and digital rectal examinations were normal.      Multiple small-mouthed diverticula were found in the descending colon,       transverse colon and ascending colon.      A 4 mm polyp was found in the transverse colon. The polyp was sessile.       The polyp was removed with a cold biopsy forceps. Resection and       retrieval were complete.      The scope was passed throught the anus to the Hartmans pouch which       showed diversion colitis. Impression:            - Diverticulosis in the descending colon, in the                         transverse colon and in the ascending colon.                        - One 4 mm polyp in the transverse colon, removed with                         a cold biopsy forceps. Resected and retrieved.                        - The scope was passed throught the anus to the                         Hartmans pouch which showed diversion colitis. Recommendation:        - Discharge patient to home.                        - Resume previous diet.                        -  Continue present medications.                        - Return to Dr. Everlene Farrier. Procedure Code(s):     --- Professional ---                        [id number], Colonoscopy through stoma; with biopsy, single                         or multiple Diagnosis Code(s):     --- Professional ---                        W10.27, Diverticulitis of large intestine without                         perforation or abscess without bleeding                        K63.5, Polyp of colon CPT copyright 2019 American Medical Association. All rights  reserved. The codes documented in this report are preliminary and upon coder review may  be revised to meet current compliance requirements. Midge Minium MD, MD 07/12/2020 9:31:58 AM This report has been signed electronically. Number of Addenda: 0 Note Initiated On: 07/12/2020 9:04 AM Estimated Blood Loss:  Estimated blood loss: none.      [facility]

## 2020-07-12 NOTE — Transfer of Care (Signed)
Immediate Anesthesia Transfer of Care Note  Patient: Monica Mckenzie  Procedure(s) Performed: COLONOSCOPY WITH PROPOFOL THROUGH COLOSTOMY (N/A )  Patient Location: PACU and Endoscopy Unit  Anesthesia Type:General  Level of Consciousness: drowsy  Airway & Oxygen Therapy: Patient Spontanous Breathing  Post-op Assessment: Report given to RN  Post vital signs: stable  Last Vitals:  Vitals Value Taken Time  BP    Temp    Pulse 105 07/12/20 0932  Resp 16 07/12/20 0932  SpO2 98 % 07/12/20 0932    Last Pain: There were no vitals filed for this visit.       Complications: No complications documented.

## 2020-07-13 ENCOUNTER — Encounter: Payer: Self-pay | Admitting: Gastroenterology

## 2020-07-13 LAB — SURGICAL PATHOLOGY

## 2020-07-14 ENCOUNTER — Encounter: Payer: Self-pay | Admitting: Gastroenterology

## 2020-08-01 ENCOUNTER — Other Ambulatory Visit: Payer: Self-pay

## 2020-08-01 ENCOUNTER — Telehealth: Payer: Self-pay

## 2020-08-01 ENCOUNTER — Encounter: Payer: Self-pay | Admitting: Surgery

## 2020-08-01 ENCOUNTER — Ambulatory Visit: Payer: Medicare HMO | Admitting: Surgery

## 2020-08-01 VITALS — BP 125/79 | HR 91 | Temp 98.4°F | Ht 61.0 in | Wt 170.4 lb

## 2020-08-01 DIAGNOSIS — Z8719 Personal history of other diseases of the digestive system: Secondary | ICD-10-CM

## 2020-08-01 MED ORDER — METRONIDAZOLE 500 MG PO TABS: ORAL_TABLET | ORAL | 0 refills | Status: DC

## 2020-08-01 MED ORDER — NEOMYCIN SULFATE 500 MG PO TABS: 500.0000 mg | ORAL_TABLET | Freq: Three times a day (TID) | ORAL | 0 refills | Status: DC

## 2020-08-01 NOTE — Patient Instructions (Signed)
Pick up your medication at the pharmacy. Please follow bowel prep instruction on the bowel sheet.  Please be sure to pick a fleets enema and use it one hour prior to coming to the hospital the day of your surgery. Use the Fleets enema rectally to clean out any mucus.  The day of your surgery you will enter through the medical Mall.

## 2020-08-01 NOTE — Telephone Encounter (Signed)
[facility]- antibiotics sent in epic. Per Dr.Pabon patient does not need to take Dulcolax.

## 2020-08-01 NOTE — H&P (View-Only) (Signed)
Outpatient Surgical Follow Up  08/01/2020  Monica Mckenzie is an 65 y.o. female.   Chief Complaint  Patient presents with  . Follow-up    Colostomy with hartmans creation    HPI: Monica Mckenzie is a 65 year old female well-known to me with a history of perforated diverticulitis that required Hartman's procedure 03/29/20.  She has done well now has regular bowel movements.  No fevers no chills no abdominal pain.  She did complete a colonoscopy by Dr. Charolotte Capuchin that I have personally reviewed showing no evidence of any suspicious lesions, other than a small polyp.  There was evidence of diverticulosis within the descending colon.  She is looking forward to a reversal of the colostomy.  She denies any fevers any chills.  She is tolerating diet and ambulating well  Past Medical History:  Diagnosis Date  . Allergy   . Asthma     Past Surgical History:  Procedure Laterality Date  . COLECTOMY WITH COLOSTOMY CREATION/HARTMANN PROCEDURE N/A 03/29/2020   Procedure: COLECTOMY WITH COLOSTOMY CREATION/HARTMANN PROCEDURE;  Surgeon: Leafy Ro, MD;  Location: [facility] ORS;  Service: General;  Laterality: N/A;  . COLONOSCOPY  2012   cleared for 10 yrs- Dr Bluford Kaufmann  . COLONOSCOPY WITH PROPOFOL N/A 07/12/2020   Procedure: COLONOSCOPY WITH PROPOFOL THROUGH COLOSTOMY;  Surgeon: Midge Minium, MD;  Location: [facility] ENDOSCOPY;  Service: Endoscopy;  Laterality: N/A;    Family History  Problem Relation Age of Onset  . Cancer Mother   . Breast cancer Mother 29  . Diabetes Father   . Hypertension Father     Social History:  reports that she has quit smoking. She has never used smokeless tobacco. She reports current alcohol use. She reports that she does not use drugs.  Allergies: No Known Allergies  Medications reviewed.    ROS Full ROS performed and is otherwise negative other than what is stated in HPI   BP 125/79   Pulse 91   Temp 98.4 F (36.9 C) (Oral)   Ht 5\' 1"  (1.549 m)   Wt 170 lb 6.4 oz (77.3 kg)    SpO2 97%   BMI 32.20 kg/m   Physical Exam Vitals and nursing note reviewed. Exam conducted with a chaperone present.  Constitutional:      General: She is not in acute distress.    Appearance: Normal appearance. She is not toxic-appearing.  Eyes:     General: No scleral icterus.       Right eye: No discharge.        Left eye: No discharge.  Cardiovascular:     Rate and Rhythm: Normal rate and regular rhythm.     Heart sounds: No murmur heard.   Pulmonary:     Effort: Pulmonary effort is normal. No respiratory distress.     Breath sounds: Normal breath sounds. No stridor. No rhonchi.  Abdominal:     General: Abdomen is flat. There is no distension.     Palpations: Abdomen is soft. There is no mass.     Tenderness: There is no abdominal tenderness. There is no guarding or rebound.     Hernia: No hernia is present.     Comments: Open wound supraumbilical measures 5 cm in length , very superficial, no infection, LLQ colostomy in place  Musculoskeletal:     Cervical back: Normal range of motion and neck supple. No rigidity or tenderness.  Skin:    General: Skin is warm and dry.     Capillary Refill: Capillary refill  takes less than 2 seconds.  Neurological:     General: No focal deficit present.     Mental Status: She is alert and oriented to person, place, and time.  Psychiatric:        Mood and Affect: Mood normal.        Behavior: Behavior normal.        Thought Content: Thought content normal.        Judgment: Judgment normal.         Assessment/Plan:  65-year-old female status post Hartman's now interested in reversal.  Discussed with patient in detail about the procedure.  I do think that she will be a good candidate for robotic approach.  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  Procedure discussed with the patient in detail.  Risks, benefits and possible implications including but not limited to:  Bleeding, infection, recurrence of diverticulitis, need for ileostomy.  Prolonged hospitalization and ileus.  She understands and wishes to proceed. Greater than 50% of the 45 minutes  visit was spent in counseling/coordination of care   Roch Quach, MD FACS General Surgeon 

## 2020-08-01 NOTE — Progress Notes (Signed)
Outpatient Surgical Follow Up  08/01/2020  Monica Mckenzie is an 65 y.o. female.   Chief Complaint  Patient presents with  . Follow-up    Colostomy with hartmans creation    HPI: Monica Mckenzie is a 65 year old female well-known to me with a history of perforated diverticulitis that required Hartman's procedure 03/29/20.  She has done well now has regular bowel movements.  No fevers no chills no abdominal pain.  She did complete a colonoscopy by Dr. Charolotte Capuchin that I have personally reviewed showing no evidence of any suspicious lesions, other than a small polyp.  There was evidence of diverticulosis within the descending colon.  She is looking forward to a reversal of the colostomy.  She denies any fevers any chills.  She is tolerating diet and ambulating well  Past Medical History:  Diagnosis Date  . Allergy   . Asthma     Past Surgical History:  Procedure Laterality Date  . COLECTOMY WITH COLOSTOMY CREATION/HARTMANN PROCEDURE N/A 03/29/2020   Procedure: COLECTOMY WITH COLOSTOMY CREATION/HARTMANN PROCEDURE;  Surgeon: Leafy Ro, MD;  Location: [facility] ORS;  Service: General;  Laterality: N/A;  . COLONOSCOPY  2012   cleared for 10 yrs- Dr Bluford Kaufmann  . COLONOSCOPY WITH PROPOFOL N/A 07/12/2020   Procedure: COLONOSCOPY WITH PROPOFOL THROUGH COLOSTOMY;  Surgeon: Midge Minium, MD;  Location: [facility] ENDOSCOPY;  Service: Endoscopy;  Laterality: N/A;    Family History  Problem Relation Age of Onset  . Cancer Mother   . Breast cancer Mother 29  . Diabetes Father   . Hypertension Father     Social History:  reports that she has quit smoking. She has never used smokeless tobacco. She reports current alcohol use. She reports that she does not use drugs.  Allergies: No Known Allergies  Medications reviewed.    ROS Full ROS performed and is otherwise negative other than what is stated in HPI   BP 125/79   Pulse 91   Temp 98.4 F (36.9 C) (Oral)   Ht 5\' 1"  (1.549 m)   Wt 170 lb 6.4 oz (77.3 kg)    SpO2 97%   BMI 32.20 kg/m   Physical Exam Vitals and nursing note reviewed. Exam conducted with a chaperone present.  Constitutional:      General: She is not in acute distress.    Appearance: Normal appearance. She is not toxic-appearing.  Eyes:     General: No scleral icterus.       Right eye: No discharge.        Left eye: No discharge.  Cardiovascular:     Rate and Rhythm: Normal rate and regular rhythm.     Heart sounds: No murmur heard.   Pulmonary:     Effort: Pulmonary effort is normal. No respiratory distress.     Breath sounds: Normal breath sounds. No stridor. No rhonchi.  Abdominal:     General: Abdomen is flat. There is no distension.     Palpations: Abdomen is soft. There is no mass.     Tenderness: There is no abdominal tenderness. There is no guarding or rebound.     Hernia: No hernia is present.     Comments: Open wound supraumbilical measures 5 cm in length , very superficial, no infection, LLQ colostomy in place  Musculoskeletal:     Cervical back: Normal range of motion and neck supple. No rigidity or tenderness.  Skin:    General: Skin is warm and dry.     Capillary Refill: Capillary refill  takes less than 2 seconds.  Neurological:     General: No focal deficit present.     Mental Status: She is alert and oriented to person, place, and time.  Psychiatric:        Mood and Affect: Mood normal.        Behavior: Behavior normal.        Thought Content: Thought content normal.        Judgment: Judgment normal.         Assessment/Plan:  65 year old female status post Hartman's now interested in reversal.  Discussed with patient in detail about the procedure.  I do think that she will be a good candidate for robotic approach.  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  Procedure discussed with the patient in detail.  Risks, benefits and possible implications including but not limited to:  Bleeding, infection, recurrence of diverticulitis, need for ileostomy.  Prolonged hospitalization and ileus.  She understands and wishes to proceed. Greater than 50% of the 45 minutes  visit was spent in counseling/coordination of care   Sterling Big, MD [facility] General Surgeon

## 2020-08-02 ENCOUNTER — Telehealth: Payer: Self-pay | Admitting: Surgery

## 2020-08-02 NOTE — Telephone Encounter (Signed)
Patient has been advised of Pre-Admission date/time, COVID Testing date and Surgery date.  Surgery Date: 08/09/20 Preadmission Testing Date: 08/05/20 (phone 8a-1p) Covid Testing Date: Not needed.    Patient has been made aware to call [phone], between 1-3:00pm the day before surgery, to find out what time to arrive for surgery.

## 2020-08-05 ENCOUNTER — Other Ambulatory Visit
Admission: RE | Admit: 2020-08-05 | Discharge: 2020-08-05 | Disposition: A | Discharge status: Home / Self Care | Payer: Medicare HMO | Source: Ambulatory Visit | Attending: Surgery | Admitting: Surgery

## 2020-08-05 ENCOUNTER — Other Ambulatory Visit: Payer: Self-pay

## 2020-08-05 HISTORY — DX: Family history of other specified conditions: Z84.89

## 2020-08-05 HISTORY — DX: Other complications of anesthesia, initial encounter: T88.59XA

## 2020-08-05 NOTE — Patient Instructions (Addendum)
Your procedure is scheduled on: 08/09/20 - Tuesday Report to the Registration Desk on the 1st floor of the Medical Mall. To find out your arrival time, please call [phone] between 1PM - 3PM on: 08/08/20 - Monday Report to [facility] for Covid Test and Labs/EKG/Bag on 08/08/20 at 0830 am.  REMEMBER: Instructions that are not followed completely may result in serious medical risk, up to and including death; or upon the discretion of your surgeon and anesthesiologist your surgery may need to be rescheduled.  Do not eat food or drink fluids after midnight the night before surgery.  No gum chewing, lozengers or hard candies.   PLEASE FOLLOW DR.PABON'S INSTRUCTION ON BOWEL PREP AS DIRECTED.  TAKE THESE MEDICATIONS THE MORNING OF SURGERY WITH A SIP OF WATER: NONE  One week prior to surgery: Stop Anti-inflammatories (NSAIDS) such as Advil, Aleve, Ibuprofen, Motrin, Naproxen, Naprosyn and Aspirin based products such as Excedrin, Goodys Powder, BC Powder.  Stop ANY OVER THE COUNTER supplements until after surgery.  No Alcohol for 24 hours before or after surgery.  No Smoking including e-cigarettes for 24 hours prior to surgery.  No chewable tobacco products for at least 6 hours prior to surgery.  No nicotine patches on the day of surgery.  Do not use any "recreational" drugs for at least a week prior to your surgery.  Please be advised that the combination of cocaine and anesthesia may have negative outcomes, up to and including death. If you test positive for cocaine, your surgery will be cancelled.  On the morning of surgery brush your teeth with toothpaste and water, you may rinse your mouth with mouthwash if you wish. Do not swallow any toothpaste or mouthwash.  Do not wear jewelry, make-up, hairpins, clips or nail polish.  Do not wear lotions, powders, or perfumes.   Do not shave body from the neck down 48 hours prior to surgery just in case you cut yourself which could leave  a site for infection.  Also, freshly shaved skin may become irritated if using the CHG soap.  Contact lenses, hearing aids and dentures may not be worn into surgery.  Do not bring valuables to the hospital. [facility] is not responsible for any missing/lost belongings or valuables.   Use CHG Soap or wipes as directed on instruction sheet.  Notify your doctor if there is any change in your medical condition (cold, fever, infection).  Wear comfortable clothing (specific to your surgery type) to the hospital.  Plan for stool softeners for home use; pain medications have a tendency to cause constipation. You can also help prevent constipation by eating foods high in fiber such as fruits and vegetables and drinking plenty of fluids as your diet allows.  After surgery, you can help prevent lung complications by doing breathing exercises.  Take deep breaths and cough every 1-2 hours. Your doctor may order a device called an Incentive Spirometer to help you take deep breaths. When coughing or sneezing, hold a pillow firmly against your incision with both hands. This is called "splinting." Doing this helps protect your incision. It also decreases belly discomfort.  If you are being admitted to the hospital overnight, leave your suitcase in the car. After surgery it may be brought to your room.  If you are being discharged the day of surgery, you will not be allowed to drive home. You will need a responsible adult (18 years or older) to drive you home and stay with you that night.  If you are taking public transportation, you will need to have a responsible adult (18 years or older) with you. Please confirm with your physician that it is acceptable to use public transportation.   Please call the Pre-admissions Testing Dept. at [phone] if you have any questions about these instructions.  Surgery Visitation Policy:  Patients undergoing a surgery or procedure may have one family member  or support person with them as long as that person is not COVID-19 positive or experiencing its symptoms.  That person may remain in the waiting area during the procedure.  Inpatient Visitation:    Visiting hours are 7 a.m. to 8 p.m. Inpatients will be allowed two visitors daily. The visitors may change each day during the patient's stay. No visitors under the age of 46. Any visitor under the age of 59 must be accompanied by an adult. The visitor must pass COVID-19 screenings, use hand sanitizer when entering and exiting the patient's room and wear a mask at all times, including in the patient's room. Patients must also wear a mask when staff or their visitor are in the room. Masking is required regardless of vaccination status.

## 2020-08-08 ENCOUNTER — Other Ambulatory Visit: Payer: Self-pay

## 2020-08-08 ENCOUNTER — Other Ambulatory Visit
Admission: RE | Admit: 2020-08-08 | Discharge: 2020-08-08 | Disposition: A | Discharge status: Home / Self Care | Payer: Medicare HMO | Source: Ambulatory Visit | Attending: Surgery | Admitting: Surgery

## 2020-08-08 DIAGNOSIS — Z20822 Contact with and (suspected) exposure to covid-19: Secondary | ICD-10-CM | POA: Insufficient documentation

## 2020-08-08 DIAGNOSIS — Z01810 Encounter for preprocedural cardiovascular examination: Secondary | ICD-10-CM | POA: Diagnosis not present

## 2020-08-08 DIAGNOSIS — Z01818 Encounter for other preprocedural examination: Secondary | ICD-10-CM | POA: Insufficient documentation

## 2020-08-08 LAB — CBC
HCT: 42.8 % (ref 36.0–46.0)
Hemoglobin: 14 g/dL (ref 12.0–15.0)
MCH: 27.2 pg (ref 26.0–34.0)
MCHC: 32.7 g/dL (ref 30.0–36.0)
MCV: 83.1 fL (ref 80.0–100.0)
Platelets: 228 10*3/uL (ref 150–400)
RBC: 5.15 MIL/uL — ABNORMAL HIGH (ref 3.87–5.11)
RDW: 15.4 % (ref 11.5–15.5)
WBC: 8 10*3/uL (ref 4.0–10.5)
nRBC: 0 % (ref 0.0–0.2)

## 2020-08-08 LAB — SARS CORONAVIRUS 2 (TAT 6-24 HRS): SARS Coronavirus 2: NEGATIVE

## 2020-08-09 ENCOUNTER — Inpatient Hospital Stay: Payer: Medicare HMO | Admitting: Anesthesiology

## 2020-08-09 ENCOUNTER — Other Ambulatory Visit: Payer: Self-pay

## 2020-08-09 ENCOUNTER — Encounter
Admission: RE | Disposition: A | Discharge status: Home / Self Care | Payer: Self-pay | Source: Home / Self Care | Attending: Surgery

## 2020-08-09 ENCOUNTER — Inpatient Hospital Stay
Admission: RE | Admit: 2020-08-09 | Discharge: 2020-08-11 | DRG: 343 | Disposition: A | Discharge status: Home / Self Care | Payer: Medicare HMO | Attending: Surgery | Admitting: Surgery

## 2020-08-09 DIAGNOSIS — Z9889 Other specified postprocedural states: Secondary | ICD-10-CM

## 2020-08-09 DIAGNOSIS — Z6832 Body mass index (BMI) 32.0-32.9, adult: Secondary | ICD-10-CM | POA: Diagnosis not present

## 2020-08-09 DIAGNOSIS — K432 Incisional hernia without obstruction or gangrene: Secondary | ICD-10-CM | POA: Diagnosis not present

## 2020-08-09 DIAGNOSIS — D72829 Elevated white blood cell count, unspecified: Secondary | ICD-10-CM | POA: Diagnosis present

## 2020-08-09 DIAGNOSIS — Z433 Encounter for attention to colostomy: Secondary | ICD-10-CM | POA: Diagnosis not present

## 2020-08-09 DIAGNOSIS — T8131XA Disruption of external operation (surgical) wound, not elsewhere classified, initial encounter: Secondary | ICD-10-CM | POA: Diagnosis not present

## 2020-08-09 DIAGNOSIS — Z87891 Personal history of nicotine dependence: Secondary | ICD-10-CM | POA: Diagnosis not present

## 2020-08-09 DIAGNOSIS — Z8719 Personal history of other diseases of the digestive system: Secondary | ICD-10-CM

## 2020-08-09 DIAGNOSIS — E669 Obesity, unspecified: Secondary | ICD-10-CM | POA: Diagnosis present

## 2020-08-09 DIAGNOSIS — Z20822 Contact with and (suspected) exposure to covid-19: Secondary | ICD-10-CM | POA: Diagnosis not present

## 2020-08-09 DIAGNOSIS — K660 Peritoneal adhesions (postprocedural) (postinfection): Secondary | ICD-10-CM | POA: Diagnosis not present

## 2020-08-09 DIAGNOSIS — K435 Parastomal hernia without obstruction or  gangrene: Secondary | ICD-10-CM | POA: Diagnosis present

## 2020-08-09 DIAGNOSIS — K5720 Diverticulitis of large intestine with perforation and abscess without bleeding: Secondary | ICD-10-CM | POA: Diagnosis not present

## 2020-08-09 DIAGNOSIS — Z933 Colostomy status: Secondary | ICD-10-CM | POA: Diagnosis not present

## 2020-08-09 HISTORY — PX: INCISIONAL HERNIA REPAIR: SHX193

## 2020-08-09 HISTORY — PX: LAPAROSCOPIC APPENDECTOMY: SHX408

## 2020-08-09 HISTORY — PX: XI ROBOTIC ASSISTED COLOSTOMY TAKEDOWN: SHX6828

## 2020-08-09 SURGERY — CLOSURE, COLOSTOMY, ROBOT-ASSISTED
Anesthesia: General

## 2020-08-09 MED ORDER — SODIUM CHLORIDE (PF) 0.9 % IJ SOLN
INTRAMUSCULAR | Status: AC
  Filled 2020-08-09: qty 10

## 2020-08-09 MED ORDER — OXYCODONE HCL 5 MG PO TABS: 5.0000 mg | ORAL_TABLET | ORAL | Status: DC | PRN

## 2020-08-09 MED ORDER — DIPHENHYDRAMINE HCL 12.5 MG/5ML PO ELIX
12.5000 mg | ORAL_SOLUTION | Freq: Four times a day (QID) | ORAL | Status: DC | PRN
  Filled 2020-08-09: qty 5

## 2020-08-09 MED ORDER — PROCHLORPERAZINE MALEATE 10 MG PO TABS
10.0000 mg | ORAL_TABLET | Freq: Four times a day (QID) | ORAL | Status: DC | PRN
  Filled 2020-08-09: qty 1

## 2020-08-09 MED ORDER — ONDANSETRON HCL 4 MG/2ML IJ SOLN
INTRAMUSCULAR | Status: DC | PRN
  Administered 2020-08-09: 4 mg via INTRAVENOUS

## 2020-08-09 MED ORDER — LIDOCAINE HCL (PF) 2 % IJ SOLN
INTRAMUSCULAR | Status: AC
  Filled 2020-08-09: qty 2

## 2020-08-09 MED ORDER — MIDAZOLAM HCL 2 MG/2ML IJ SOLN
INTRAMUSCULAR | Status: AC
  Filled 2020-08-09: qty 2

## 2020-08-09 MED ORDER — BUPIVACAINE-EPINEPHRINE 0.25% -1:200000 IJ SOLN
INTRAMUSCULAR | Status: DC | PRN
  Administered 2020-08-09: 30 mL

## 2020-08-09 MED ORDER — LACTATED RINGERS IV SOLN: INTRAVENOUS | Status: DC

## 2020-08-09 MED ORDER — METHOCARBAMOL 500 MG PO TABS: 500.0000 mg | ORAL_TABLET | Freq: Four times a day (QID) | ORAL | Status: DC | PRN

## 2020-08-09 MED ORDER — FENTANYL CITRATE (PF) 100 MCG/2ML IJ SOLN
INTRAMUSCULAR | Status: DC | PRN
  Administered 2020-08-09 (×2): 50 ug via INTRAVENOUS
  Administered 2020-08-09: 100 ug via INTRAVENOUS

## 2020-08-09 MED ORDER — SODIUM CHLORIDE 0.9 % IV SOLN
2.0000 g | Freq: Three times a day (TID) | INTRAVENOUS | Status: AC
  Administered 2020-08-09 – 2020-08-10 (×3): 2 g via INTRAVENOUS
  Filled 2020-08-09 (×4): qty 2

## 2020-08-09 MED ORDER — ALVIMOPAN 12 MG PO CAPS
ORAL_CAPSULE | ORAL | Status: AC
  Administered 2020-08-09: 12 mg via ORAL
  Filled 2020-08-09: qty 1

## 2020-08-09 MED ORDER — OXYCODONE HCL 5 MG PO TABS: 5.0000 mg | ORAL_TABLET | Freq: Once | ORAL | Status: AC | PRN

## 2020-08-09 MED ORDER — PROCHLORPERAZINE EDISYLATE 10 MG/2ML IJ SOLN: 5.0000 mg | Freq: Four times a day (QID) | INTRAMUSCULAR | Status: DC | PRN

## 2020-08-09 MED ORDER — HEPARIN SODIUM (PORCINE) 5000 UNIT/ML IJ SOLN
INTRAMUSCULAR | Status: AC
  Administered 2020-08-09: 5000 [IU] via SUBCUTANEOUS
  Filled 2020-08-09: qty 1

## 2020-08-09 MED ORDER — ALVIMOPAN 12 MG PO CAPS: 12.0000 mg | ORAL_CAPSULE | ORAL | Status: AC

## 2020-08-09 MED ORDER — FENTANYL CITRATE (PF) 250 MCG/5ML IJ SOLN
INTRAMUSCULAR | Status: AC
  Filled 2020-08-09: qty 5

## 2020-08-09 MED ORDER — OXYCODONE HCL 5 MG PO TABS
ORAL_TABLET | ORAL | Status: AC
  Administered 2020-08-09: 5 mg via ORAL
  Filled 2020-08-09: qty 1

## 2020-08-09 MED ORDER — PROPOFOL 10 MG/ML IV BOLUS
INTRAVENOUS | Status: AC
  Filled 2020-08-09: qty 20

## 2020-08-09 MED ORDER — CHLORHEXIDINE GLUCONATE 0.12 % MT SOLN
OROMUCOSAL | Status: AC
  Administered 2020-08-09: 15 mL via OROMUCOSAL
  Filled 2020-08-09: qty 15

## 2020-08-09 MED ORDER — CHLORHEXIDINE GLUCONATE CLOTH 2 % EX PADS
6.0000 | MEDICATED_PAD | Freq: Once | CUTANEOUS | Status: DC
Start: 2020-08-09 — End: 2020-08-09

## 2020-08-09 MED ORDER — SODIUM CHLORIDE 0.9 % IV SOLN
INTRAVENOUS | Status: DC | PRN
  Administered 2020-08-09: 70 mL

## 2020-08-09 MED ORDER — DEXMEDETOMIDINE (PRECEDEX) IN NS 20 MCG/5ML (4 MCG/ML) IV SYRINGE
PREFILLED_SYRINGE | INTRAVENOUS | Status: AC
  Filled 2020-08-09: qty 5

## 2020-08-09 MED ORDER — KETOROLAC TROMETHAMINE 15 MG/ML IJ SOLN
15.0000 mg | Freq: Four times a day (QID) | INTRAMUSCULAR | Status: DC
  Administered 2020-08-10 – 2020-08-11 (×6): 15 mg via INTRAVENOUS
  Filled 2020-08-09 (×6): qty 1

## 2020-08-09 MED ORDER — DIPHENHYDRAMINE HCL 50 MG/ML IJ SOLN: 12.5000 mg | Freq: Four times a day (QID) | INTRAMUSCULAR | Status: DC | PRN

## 2020-08-09 MED ORDER — ROCURONIUM BROMIDE 10 MG/ML (PF) SYRINGE
PREFILLED_SYRINGE | INTRAVENOUS | Status: AC
  Filled 2020-08-09: qty 10

## 2020-08-09 MED ORDER — ROCURONIUM BROMIDE 100 MG/10ML IV SOLN
INTRAVENOUS | Status: DC | PRN
  Administered 2020-08-09: 50 mg via INTRAVENOUS
  Administered 2020-08-09: 20 mg via INTRAVENOUS
  Administered 2020-08-09: 30 mg via INTRAVENOUS

## 2020-08-09 MED ORDER — FENTANYL CITRATE (PF) 100 MCG/2ML IJ SOLN
INTRAMUSCULAR | Status: AC
  Filled 2020-08-09: qty 2

## 2020-08-09 MED ORDER — HEPARIN SODIUM (PORCINE) 5000 UNIT/ML IJ SOLN: 5000.0000 [IU] | Freq: Once | INTRAMUSCULAR | Status: AC

## 2020-08-09 MED ORDER — SODIUM CHLORIDE (PF) 0.9 % IJ SOLN
INTRAMUSCULAR | Status: AC
  Filled 2020-08-09: qty 50

## 2020-08-09 MED ORDER — KETAMINE HCL 50 MG/ML IJ SOLN
INTRAMUSCULAR | Status: AC
  Filled 2020-08-09: qty 1

## 2020-08-09 MED ORDER — SODIUM CHLORIDE 0.9 % IV SOLN
2.0000 g | INTRAVENOUS | Status: AC
  Administered 2020-08-09: 2 g via INTRAVENOUS

## 2020-08-09 MED ORDER — APREPITANT 40 MG PO CAPS
ORAL_CAPSULE | ORAL | Status: AC
  Administered 2020-08-09: 40 mg via ORAL
  Filled 2020-08-09: qty 1

## 2020-08-09 MED ORDER — ONDANSETRON HCL 4 MG/2ML IJ SOLN: 4.0000 mg | Freq: Four times a day (QID) | INTRAMUSCULAR | Status: DC | PRN

## 2020-08-09 MED ORDER — SODIUM CHLORIDE 0.9 % IV SOLN
INTRAVENOUS | Status: AC
  Filled 2020-08-09: qty 2

## 2020-08-09 MED ORDER — APREPITANT 40 MG PO CAPS: 40.0000 mg | ORAL_CAPSULE | Freq: Once | ORAL | Status: AC

## 2020-08-09 MED ORDER — LIDOCAINE HCL (CARDIAC) PF 100 MG/5ML IV SOSY
PREFILLED_SYRINGE | INTRAVENOUS | Status: DC | PRN
  Administered 2020-08-09: 20 mg via INTRAVENOUS
  Administered 2020-08-09 (×2): 40 mg via INTRAVENOUS

## 2020-08-09 MED ORDER — PHENYLEPHRINE HCL (PRESSORS) 10 MG/ML IV SOLN
INTRAVENOUS | Status: AC
  Filled 2020-08-09: qty 1

## 2020-08-09 MED ORDER — ONDANSETRON 4 MG PO TBDP: 4.0000 mg | ORAL_TABLET | Freq: Four times a day (QID) | ORAL | Status: DC | PRN

## 2020-08-09 MED ORDER — PANTOPRAZOLE SODIUM 40 MG IV SOLR
40.0000 mg | Freq: Every day | INTRAVENOUS | Status: DC
  Administered 2020-08-09 – 2020-08-10 (×2): 40 mg via INTRAVENOUS
  Filled 2020-08-09 (×2): qty 40

## 2020-08-09 MED ORDER — CELECOXIB 200 MG PO CAPS
200.0000 mg | ORAL_CAPSULE | ORAL | Status: AC
Start: 2020-08-09 — End: 2020-08-09

## 2020-08-09 MED ORDER — FAMOTIDINE 20 MG PO TABS
ORAL_TABLET | ORAL | Status: AC
  Administered 2020-08-09: 20 mg via ORAL
  Filled 2020-08-09: qty 1

## 2020-08-09 MED ORDER — CELECOXIB 200 MG PO CAPS
ORAL_CAPSULE | ORAL | Status: AC
  Administered 2020-08-09: 200 mg via ORAL
  Filled 2020-08-09: qty 1

## 2020-08-09 MED ORDER — FAMOTIDINE 20 MG PO TABS: 20.0000 mg | ORAL_TABLET | Freq: Once | ORAL | Status: AC

## 2020-08-09 MED ORDER — KETAMINE HCL 10 MG/ML IJ SOLN
INTRAMUSCULAR | Status: DC | PRN
  Administered 2020-08-09: 30 mg via INTRAVENOUS
  Administered 2020-08-09: 12.0588 mg via INTRAVENOUS
  Administered 2020-08-09 (×3): 19.325 mg via INTRAVENOUS

## 2020-08-09 MED ORDER — DEXAMETHASONE SODIUM PHOSPHATE 10 MG/ML IJ SOLN
INTRAMUSCULAR | Status: DC | PRN
  Administered 2020-08-09: 10 mg via INTRAVENOUS

## 2020-08-09 MED ORDER — ORAL CARE MOUTH RINSE: 15.0000 mL | Freq: Once | OROMUCOSAL | Status: AC

## 2020-08-09 MED ORDER — PROPOFOL 10 MG/ML IV BOLUS
INTRAVENOUS | Status: DC | PRN
  Administered 2020-08-09: 130 mg via INTRAVENOUS

## 2020-08-09 MED ORDER — CHLORHEXIDINE GLUCONATE 0.12 % MT SOLN: 15.0000 mL | Freq: Once | OROMUCOSAL | Status: AC

## 2020-08-09 MED ORDER — OXYCODONE HCL 5 MG/5ML PO SOLN: 5.0000 mg | Freq: Once | ORAL | Status: AC | PRN

## 2020-08-09 MED ORDER — DEXMEDETOMIDINE HCL 200 MCG/2ML IV SOLN
INTRAVENOUS | Status: DC | PRN
  Administered 2020-08-09: 12 ug via INTRAVENOUS

## 2020-08-09 MED ORDER — FENTANYL CITRATE (PF) 100 MCG/2ML IJ SOLN
INTRAMUSCULAR | Status: AC
  Administered 2020-08-09: 25 ug via INTRAVENOUS
  Filled 2020-08-09: qty 2

## 2020-08-09 MED ORDER — MEPERIDINE HCL 25 MG/ML IJ SOLN: 6.2500 mg | INTRAMUSCULAR | Status: DC | PRN

## 2020-08-09 MED ORDER — DEXAMETHASONE SODIUM PHOSPHATE 10 MG/ML IJ SOLN
INTRAMUSCULAR | Status: AC
  Filled 2020-08-09: qty 1

## 2020-08-09 MED ORDER — FENTANYL CITRATE (PF) 100 MCG/2ML IJ SOLN
25.0000 ug | INTRAMUSCULAR | Status: DC | PRN
  Administered 2020-08-09 (×3): 25 ug via INTRAVENOUS

## 2020-08-09 MED ORDER — ACETAMINOPHEN 500 MG PO TABS
1000.0000 mg | ORAL_TABLET | ORAL | Status: AC
Start: 2020-08-09 — End: 2020-08-09

## 2020-08-09 MED ORDER — MIDAZOLAM HCL 2 MG/2ML IJ SOLN
INTRAMUSCULAR | Status: DC | PRN
  Administered 2020-08-09: 2 mg via INTRAVENOUS

## 2020-08-09 MED ORDER — BUPIVACAINE LIPOSOME 1.3 % IJ SUSP
INTRAMUSCULAR | Status: AC
  Filled 2020-08-09: qty 20

## 2020-08-09 MED ORDER — PROMETHAZINE HCL 25 MG/ML IJ SOLN: 6.2500 mg | INTRAMUSCULAR | Status: DC | PRN

## 2020-08-09 MED ORDER — ENOXAPARIN SODIUM 40 MG/0.4ML IJ SOSY
40.0000 mg | PREFILLED_SYRINGE | INTRAMUSCULAR | Status: DC
  Administered 2020-08-10 – 2020-08-11 (×2): 40 mg via SUBCUTANEOUS
  Filled 2020-08-09 (×2): qty 0.4

## 2020-08-09 MED ORDER — MORPHINE SULFATE (PF) 4 MG/ML IV SOLN
4.0000 mg | INTRAVENOUS | Status: DC | PRN
  Administered 2020-08-09: 4 mg via INTRAVENOUS
  Filled 2020-08-09: qty 1

## 2020-08-09 MED ORDER — PREGABALIN 50 MG PO CAPS
100.0000 mg | ORAL_CAPSULE | Freq: Three times a day (TID) | ORAL | Status: DC
  Administered 2020-08-09 – 2020-08-11 (×5): 100 mg via ORAL
  Filled 2020-08-09 (×5): qty 2

## 2020-08-09 MED ORDER — ACETAMINOPHEN 500 MG PO TABS
ORAL_TABLET | ORAL | Status: AC
  Administered 2020-08-09: 1000 mg via ORAL
  Filled 2020-08-09: qty 2

## 2020-08-09 MED ORDER — ALVIMOPAN 12 MG PO CAPS: 12.0000 mg | ORAL_CAPSULE | ORAL | Status: DC

## 2020-08-09 MED ORDER — SODIUM CHLORIDE 0.9 % IV SOLN: INTRAVENOUS | Status: DC

## 2020-08-09 MED ORDER — ACETAMINOPHEN 500 MG PO TABS
1000.0000 mg | ORAL_TABLET | Freq: Four times a day (QID) | ORAL | Status: DC
  Administered 2020-08-10 – 2020-08-11 (×6): 1000 mg via ORAL
  Filled 2020-08-09 (×6): qty 2

## 2020-08-09 MED ORDER — GABAPENTIN 300 MG PO CAPS: 300.0000 mg | ORAL_CAPSULE | ORAL | Status: AC

## 2020-08-09 MED ORDER — SUGAMMADEX SODIUM 200 MG/2ML IV SOLN
INTRAVENOUS | Status: DC | PRN
  Administered 2020-08-09: 200 mg via INTRAVENOUS

## 2020-08-09 MED ORDER — BUPIVACAINE-EPINEPHRINE (PF) 0.25% -1:200000 IJ SOLN
INTRAMUSCULAR | Status: AC
  Filled 2020-08-09: qty 30

## 2020-08-09 MED ORDER — ONDANSETRON HCL 4 MG/2ML IJ SOLN
INTRAMUSCULAR | Status: AC
  Filled 2020-08-09: qty 2

## 2020-08-09 MED ORDER — GABAPENTIN 300 MG PO CAPS
ORAL_CAPSULE | ORAL | Status: AC
  Administered 2020-08-09: 300 mg via ORAL
  Filled 2020-08-09: qty 1

## 2020-08-09 MED ORDER — INDOCYANINE GREEN 25 MG IV SOLR
INTRAVENOUS | Status: DC | PRN
  Administered 2020-08-09: 5 mg via INTRAVENOUS

## 2020-08-09 SURGICAL SUPPLY — 98 items
BAG LAPAROSCOPIC 12 15 PORT 16 (BASKET) ×2 IMPLANT
BAG RETRIEVAL 12/15 (BASKET) ×3
CANNULA REDUC XI 12-8 STAPL (CANNULA) ×1
CANNULA REDUCER 12-8 DVNC XI (CANNULA) ×2 IMPLANT
CHLORAPREP W/TINT 26 (MISCELLANEOUS) ×3 IMPLANT
CLIP VESOLOCK LG 6/CT PURPLE (CLIP) IMPLANT
CLIP VESOLOCK MED LG 6/CT (CLIP) IMPLANT
COVER MAYO STAND REUSABLE (DRAPES) ×3 IMPLANT
COVER TIP SHEARS 8 DVNC (MISCELLANEOUS) ×2 IMPLANT
COVER TIP SHEARS 8MM DA VINCI (MISCELLANEOUS) ×1
COVER WAND RF STERILE (DRAPES) ×3 IMPLANT
DECANTER SPIKE VIAL GLASS SM (MISCELLANEOUS) ×3 IMPLANT
DEFOGGER SCOPE WARMER CLEARIFY (MISCELLANEOUS) ×3 IMPLANT
DERMABOND ADVANCED (GAUZE/BANDAGES/DRESSINGS) ×1
DERMABOND ADVANCED .7 DNX12 (GAUZE/BANDAGES/DRESSINGS) ×2 IMPLANT
DRAPE 3/4 80X56 (DRAPES) ×3 IMPLANT
DRAPE ARM DVNC X/XI (DISPOSABLE) ×8 IMPLANT
DRAPE COLUMN DVNC XI (DISPOSABLE) ×2 IMPLANT
DRAPE DA VINCI XI ARM (DISPOSABLE) ×4
DRAPE DA VINCI XI COLUMN (DISPOSABLE) ×1
DRAPE LEGGINS SURG 28X43 STRL (DRAPES) ×3 IMPLANT
DRAPE UNDER BUTTOCK W/FLU (DRAPES) ×3 IMPLANT
ELECT BLADE 6.5 EXT (BLADE) IMPLANT
ELECT CAUTERY BLADE 6.4 (BLADE) ×3 IMPLANT
ELECT REM PT RETURN 9FT ADLT (ELECTROSURGICAL) ×3
ELECTRODE REM PT RTRN 9FT ADLT (ELECTROSURGICAL) ×2 IMPLANT
GAUZE PACKING 1/4 X5 YD (GAUZE/BANDAGES/DRESSINGS) ×3 IMPLANT
GLOVE SURG ENC MOIS LTX SZ7 (GLOVE) ×9 IMPLANT
GOWN STRL REUS W/ TWL LRG LVL3 (GOWN DISPOSABLE) ×10 IMPLANT
GOWN STRL REUS W/TWL LRG LVL3 (GOWN DISPOSABLE) ×5
GRASPER LAPSCPC 5X45 DSP (INSTRUMENTS) ×3 IMPLANT
GRASPER SUT TROCAR 14GX15 (MISCELLANEOUS) ×3 IMPLANT
HANDLE YANKAUER SUCT BULB TIP (MISCELLANEOUS) ×3 IMPLANT
IRRIGATION STRYKERFLOW (MISCELLANEOUS) ×2 IMPLANT
IRRIGATOR STRYKERFLOW (MISCELLANEOUS) ×3
IV NS 1000ML (IV SOLUTION) ×1
IV NS 1000ML BAXH (IV SOLUTION) ×2 IMPLANT
KIT IMAGING PINPOINTPAQ (MISCELLANEOUS) ×3 IMPLANT
KIT PINK PAD W/HEAD ARE REST (MISCELLANEOUS) ×3
KIT PINK PAD W/HEAD ARM REST (MISCELLANEOUS) ×2 IMPLANT
LABEL OR SOLS (LABEL) IMPLANT
MANIFOLD NEPTUNE II (INSTRUMENTS) ×3 IMPLANT
NEEDLE HYPO 22GX1.5 SAFETY (NEEDLE) ×3 IMPLANT
NEEDLE INSUFFLATION 14GA 120MM (NEEDLE) ×3 IMPLANT
NS IRRIG 500ML POUR BTL (IV SOLUTION) ×3 IMPLANT
OBTURATOR OPTICAL STANDARD 8MM (TROCAR)
OBTURATOR OPTICAL STND 8 DVNC (TROCAR)
OBTURATOR OPTICALSTD 8 DVNC (TROCAR) IMPLANT
PACK LAP CHOLECYSTECTOMY (MISCELLANEOUS) ×3 IMPLANT
PAD ABD DERMACEA PRESS 5X9 (GAUZE/BANDAGES/DRESSINGS) ×6 IMPLANT
PAD PREP 24X41 OB/GYN DISP (PERSONAL CARE ITEMS) ×3 IMPLANT
PENCIL ELECTRO HAND CTR (MISCELLANEOUS) ×3 IMPLANT
RELOAD STAPLER 2.5X60 WHT DVNC (STAPLE) IMPLANT
RELOAD STAPLER 3.5X45 BLU DVNC (STAPLE) IMPLANT
RELOAD STAPLER 3.5X60 BLU DVNC (STAPLE) ×4 IMPLANT
SEAL CANN UNIV 5-8 DVNC XI (MISCELLANEOUS) ×6 IMPLANT
SEAL XI 5MM-8MM UNIVERSAL (MISCELLANEOUS) ×3
SEALER VESSEL DA VINCI XI (MISCELLANEOUS)
SEALER VESSEL EXT DVNC XI (MISCELLANEOUS) IMPLANT
SOL PREP PROV IODINE SCRUB 4OZ (MISCELLANEOUS) ×6 IMPLANT
SOL PREP PVP 2OZ (MISCELLANEOUS) ×3
SOLUTION ELECTROLUBE (MISCELLANEOUS) ×3 IMPLANT
SOLUTION PREP PVP 2OZ (MISCELLANEOUS) ×2 IMPLANT
SPONGE LAP 18X18 RF (DISPOSABLE) ×3 IMPLANT
SPONGE LAP 4X18 RFD (DISPOSABLE) ×3 IMPLANT
STAPLER 45 DA VINCI SURE FORM (STAPLE)
STAPLER 45 SUREFORM DVNC (STAPLE) IMPLANT
STAPLER 60 DA VINCI SURE FORM (STAPLE) ×1
STAPLER 60 SUREFORM DVNC (STAPLE) ×2 IMPLANT
STAPLER CANNULA SEAL DVNC XI (STAPLE) ×2 IMPLANT
STAPLER CANNULA SEAL XI (STAPLE) ×1
STAPLER CIRCULAR MANUAL XL 25 (STAPLE) ×3 IMPLANT
STAPLER RELOAD 2.5X60 WHITE (STAPLE)
STAPLER RELOAD 2.5X60 WHT DVNC (STAPLE)
STAPLER RELOAD 3.5X45 BLU DVNC (STAPLE)
STAPLER RELOAD 3.5X45 BLUE (STAPLE)
STAPLER RELOAD 3.5X60 BLU DVNC (STAPLE) ×4
STAPLER RELOAD 3.5X60 BLUE (STAPLE) ×2
SURGILUBE 2OZ TUBE FLIPTOP (MISCELLANEOUS) ×3 IMPLANT
SUT DVC VLOC 3-0 CL 6 P-12 (SUTURE) IMPLANT
SUT MNCRL AB 4-0 PS2 18 (SUTURE) ×3 IMPLANT
SUT PDS AB 0 CT1 27 (SUTURE) ×9 IMPLANT
SUT SILK 2 0 (SUTURE) ×1
SUT SILK 2 0 SH (SUTURE) ×21 IMPLANT
SUT SILK 2 0SH CR/8 30 (SUTURE) IMPLANT
SUT SILK 2-0 30XBRD TIE 12 (SUTURE) ×2 IMPLANT
SUT STRATAFIX 0 PDS+ CT-2 23 (SUTURE) ×3
SUT VIC AB 2-0 SH 27 (SUTURE) ×1
SUT VIC AB 2-0 SH 27XBRD (SUTURE) ×2 IMPLANT
SUT VICRYL 0 AB UR-6 (SUTURE) ×3 IMPLANT
SUTURE STRATFX 0 PDS+ CT-2 23 (SUTURE) ×2 IMPLANT
SYR 20ML LL LF (SYRINGE) ×6 IMPLANT
SYS TROCAR 1.5-3 SLV ABD GEL (ENDOMECHANICALS)
SYSTEM TROCR 1.5-3 SLV ABD GEL (ENDOMECHANICALS) IMPLANT
TRAY FOLEY MTR SLVR 16FR STAT (SET/KITS/TRAYS/PACK) ×3 IMPLANT
TRAY FOLEY SLVR 16FR LF STAT (SET/KITS/TRAYS/PACK) ×3 IMPLANT
TROCAR BALLN GELPORT 12X130M (ENDOMECHANICALS) IMPLANT
TROCAR XCEL NON-BLD 5MMX100MML (ENDOMECHANICALS) ×3 IMPLANT

## 2020-08-09 NOTE — Op Note (Signed)
PROCEDURES: 1. Robotic assisted Laparoscopic colostomy takedown 2. Robotic Laparoscopic takedown of splenic flexure 3. Robotic assisted appendectomy 4. Incisional hernia repair 5. Intermediate wound closure of midline wound 13cms (delayed primary closure from prior open woumd)  6. Assessment of pedicle graft  (anastomosis using ICG intraoperatively)   Pre-operative Diagnosis: History of perforated diverticulitis with end colostomy  Post-operative Diagnosis: same  Surgeon: Merri Ray Cruzita Lipa   Assistants: Mr. Manus Rudd [facility]. ( required for the EEA anastomosis and for exposure)  Anesthesia: General endotracheal anesthesia  ASA Class: 2   Surgeon: Sterling Big , MD FACS  Anesthesia: Gen. with endotracheal tube   Findings: Adhesions from the colon to the pelvic wall Adhesions from the small bowel to the abdominal wall Tension free anastomosis with very good perfusion ( ICG and clinical inspection) and negative intraoperative leak with two doughnuts   Estimated Blood Loss: 20cc                Specimens: colon       Complications: none         Condition: stable  Procedure Details  The patient was seen again in the Holding Room. The benefits, complications, treatment options, and expected outcomes were discussed with the patient. The risks of bleeding, infection, recurrence of symptoms, failure to resolve symptoms, anastomotic leak, bowel injury, any of which could require further surgery were reviewed with the patient.   The patient was taken to Operating Room, identified  and the procedure verified.  A Time Out was held and the above information confirmed.  Prior to the induction of general anesthesia, antibiotic prophylaxis was administered. VTE prophylaxis was in place. General endotracheal anesthesia was then administered and tolerated well. After the induction, the abdomen was prepped with Chloraprep and draped in the sterile fashion. The patient was positioned in lithotomy  position. I started by measuring the stoma. It was retracted and the lumen of the colon allowed a 25 mm dilator. I went ahead and inserted the anvil portion of the EEEA stapler under direct visualization. The stoma was closed  With a 2-0 silk suture. The abdominal cavity was again prepped and draped. Veres needle inserted into Palmer's point. Good pneumoperitoneum obtained and saline drop test and pressures were appropriate. 8 mm incision was done on the right abdominal wall and optiview was used to entered the abdominal cavity. No evidence of injuries were encountered. Good pneumoperitoneum was obtained, no hemodynamic changes were apparent. . Three 8 mm robotic ports were placed under direct visualization and an additional 5 mm assist port was placed. Patient was positioned in steep trendelenburg and left side up. Robot was brought to the field and docked in the standard fashion. WE maintained visualization of our instruments at all times and avoided any collision between arms. I scrubbed out and went to the console. There were dense adhesions from colon to the abdominal wall that where lysed in the standard fashion with the scissors.  The splenic flexure was taken down with scissors as well.  I was able to identify the descending colon going into the abdominal wall.  Using a 60 mm stapler the colon was divided close to the abdominal wall.   we mobilized the descending colon IN a medial to lateral fashion. We preserved the ureter at all times. Pelvic dissection was performed in a standard fashion, being in the areolar space anterior to the sacrum. THe mesorectum was divided with the vessel sealer. We dissected the rectum circumferentially. The appendix was found to have  reactive inflammation and I decided that due to the difficulty of the pelvic dissection that it was more than appropriate to perform an appendectomy. It was elevated and mesoappendix divided with bipolar forceps. Appendix divided with  60 mm stapler.   Attention was turned to the descending colon were we identified the anvil and we were able to deliver the spike via the stapler line on the descending colon. We used ICG green to make sure the distal descending colon had good perfusion. We made sure we had adequate reach so the anastomosis could be performed tension free. Mr. Manus Rudd [facility] was able to pass a 25 mm standard EEA stapler device through the anus and Under direct visualization we performed an end to end anastomosis with the EEA device. A leak test was performed inflating the colon with a Toomey syringe and a rubber catheter. No evidence of leak was observed. There was also adequate hemostasis. The specimen was removed in a bag.  The robot was undocked and  I scrubbed back in.  All the laparoscopic ports were removed and a second look showed no evidence of any bleeding or any other injuries.  Liposomal marcaine was infiltrated at all incision sites in a full thickness fashion.  We changed gloves and  close the 12 mm port using the Montez Morita divide in a figure of eight fashion. . The skin incisions were closed with 4-0 Monocryl. Dermabond was used to coat the laparoscopic incision  Attention was turned to the end colostomy site , elliptical incision was created and fascia identified. Stum of the colostomy was removed and sent for pathology. We visualized a parastomal hernia and it was closed in a two layer fashion with 0 PDS suture. Sub q closed with interrupted 3-0 vicryl.  There was a midline wound that was open from prior operation It measured 13 cm and it was closed in a two layer fashion with absorbable sutures loosely. We left wicks in between skin gaps. And placed sterile dressing.   . Needle and laparotomy count were correct and there were no immediate complications.  Sterling Big, MD, FACS

## 2020-08-09 NOTE — Interval H&P Note (Signed)
History and Physical Interval Note:  08/09/2020 12:01 PM  Monica Mckenzie  has presented today for surgery, with the diagnosis of colostomy.  The various methods of treatment have been discussed with the patient and family. After consideration of risks, benefits and other options for treatment, the patient has consented to  Procedure(s): XI ROBOTIC ASSISTED COLOSTOMY TAKEDOWN with RNFA to assist (N/A) as a surgical intervention.  The patient's history has been reviewed, patient examined, no change in status, stable for surgery.  I have reviewed the patient's chart and labs.  Questions were answered to the patient's satisfaction.     Jari Dipasquale F Cynethia Schindler

## 2020-08-09 NOTE — Anesthesia Preprocedure Evaluation (Signed)
Anesthesia Evaluation  Patient identified by MRN, date of birth, ID band Patient awake    Reviewed: Allergy & Precautions, NPO status , Patient's Chart, lab work & pertinent test results  History of Anesthesia Complications Negative for: history of anesthetic complications  Airway Mallampati: III  TM Distance: >3 FB Neck ROM: Full    Dental  (+) Implants   Pulmonary asthma (allergy induced) , neg sleep apnea, neg COPD, former smoker,    breath sounds clear to auscultation- rhonchi (-) wheezing      Cardiovascular Exercise Tolerance: Good (-) hypertension(-) CAD, (-) Past MI, (-) Cardiac Stents and (-) CABG  Rhythm:Regular Rate:Normal - Systolic murmurs and - Diastolic murmurs    Neuro/Psych neg Seizures negative neurological ROS  negative psych ROS   GI/Hepatic negative GI ROS, Neg liver ROS,   Endo/Other  negative endocrine ROSneg diabetes  Renal/GU negative Renal ROS     Musculoskeletal negative musculoskeletal ROS (+)   Abdominal (+) + obese,   Peds  Hematology negative hematology ROS (+)   Anesthesia Other Findings Past Medical History: No date: Allergy No date: Asthma     Comment:  ALLERGY RELATED No date: Complication of anesthesia     Comment:  NAUSEA No date: Family history of adverse reaction to anesthesia     Comment:  FATHER HAS PONV   Reproductive/Obstetrics                             Anesthesia Physical Anesthesia Plan  ASA: II  Anesthesia Plan: General   Post-op Pain Management:    Induction: Intravenous  PONV Risk Score and Plan: 2 and Ondansetron, Dexamethasone and Midazolam  Airway Management Planned: Oral ETT  Additional Equipment:   Intra-op Plan:   Post-operative Plan: Extubation in OR  Informed Consent: I have reviewed the patients History and Physical, chart, labs and discussed the procedure including the risks, benefits and alternatives for the  proposed anesthesia with the patient or authorized representative who has indicated his/her understanding and acceptance.     Dental advisory given  Plan Discussed with: CRNA and Anesthesiologist  Anesthesia Plan Comments:         Anesthesia Quick Evaluation

## 2020-08-09 NOTE — Anesthesia Procedure Notes (Signed)
Procedure Name: Intubation Date/Time: 08/09/2020 12:45 PM Performed by: Rona Ravens, CRNA Pre-anesthesia Checklist: Patient identified, Patient being monitored, Timeout performed, Emergency Drugs available and Suction available Patient Re-evaluated:Patient Re-evaluated prior to induction Oxygen Delivery Method: Circle system utilized Preoxygenation: Pre-oxygenation with 100% oxygen Induction Type: IV induction Ventilation: Mask ventilation without difficulty Laryngoscope Size: Mac and 3 Grade View: Grade II Tube type: Oral Tube size: 7.0 mm Number of attempts: 1 Airway Equipment and Method: Stylet Placement Confirmation: ETT inserted through vocal cords under direct vision,  positive ETCO2 and breath sounds checked- equal and bilateral Secured at: 21 cm Tube secured with: Tape Dental Injury: Teeth and Oropharynx as per pre-operative assessment

## 2020-08-09 NOTE — Transfer of Care (Signed)
Immediate Anesthesia Transfer of Care Note  Patient: Monica Mckenzie  Procedure(s) Performed: XI ROBOTIC ASSISTED COLOSTOMY TAKEDOWN with RNFA to assist (N/A ) HERNIA REPAIR INCISIONAL (N/A ) APPENDECTOMY LAPAROSCOPIC  Patient Location: PACU  Anesthesia Type:General  Level of Consciousness: drowsy  Airway & Oxygen Therapy: Patient Spontanous Breathing and Patient connected to face mask oxygen  Post-op Assessment: Report given to RN and Post -op Vital signs reviewed and stable  Post vital signs: Reviewed and stable  Last Vitals:  Vitals Value Taken Time  BP 138/74 08/09/20 1805  Temp    Pulse 81 08/09/20 1808  Resp 16 08/09/20 1808  SpO2 100 % 08/09/20 1808  Vitals shown include unvalidated device data.  Last Pain:  Vitals:   08/09/20 1130  TempSrc: Oral  PainSc: 0-No pain         Complications: No complications documented.

## 2020-08-10 ENCOUNTER — Encounter: Payer: Self-pay | Admitting: Surgery

## 2020-08-10 ENCOUNTER — Ambulatory Visit: Payer: Medicare HMO | Admitting: Surgery

## 2020-08-10 LAB — CBC
HCT: 37.9 % (ref 36.0–46.0)
Hemoglobin: 12.6 g/dL (ref 12.0–15.0)
MCH: 27.4 pg (ref 26.0–34.0)
MCHC: 33.2 g/dL (ref 30.0–36.0)
MCV: 82.4 fL (ref 80.0–100.0)
Platelets: 237 10*3/uL (ref 150–400)
RBC: 4.6 MIL/uL (ref 3.87–5.11)
RDW: 15.5 % (ref 11.5–15.5)
WBC: 17.8 10*3/uL — ABNORMAL HIGH (ref 4.0–10.5)
nRBC: 0 % (ref 0.0–0.2)

## 2020-08-10 LAB — BASIC METABOLIC PANEL
Anion gap: 8 (ref 5–15)
BUN: 16 mg/dL (ref 8–23)
CO2: 22 mmol/L (ref 22–32)
Calcium: 8.7 mg/dL — ABNORMAL LOW (ref 8.9–10.3)
Chloride: 106 mmol/L (ref 98–111)
Creatinine, Ser: 0.8 mg/dL (ref 0.44–1.00)
GFR, Estimated: >60 mL/min (ref >60)
Glucose, Bld: 135 mg/dL — ABNORMAL HIGH (ref 70–99)
Potassium: 4.4 mmol/L (ref 3.5–5.1)
Sodium: 136 mmol/L (ref 135–145)

## 2020-08-10 LAB — HIV ANTIBODY (ROUTINE TESTING W REFLEX): HIV Screen 4th Generation wRfx: NONREACTIVE

## 2020-08-10 MED ORDER — CHLORHEXIDINE GLUCONATE CLOTH 2 % EX PADS
6.0000 | MEDICATED_PAD | Freq: Every day | CUTANEOUS | Status: DC
  Administered 2020-08-10: 6 via TOPICAL

## 2020-08-10 NOTE — Plan of Care (Signed)
  Problem: Health Behavior/Discharge Planning: Goal: Ability to manage health-related needs will improve Outcome: Progressing   Problem: Clinical Measurements: Goal: Ability to maintain clinical measurements within normal limits will improve Outcome: Progressing Goal: Will remain free from infection Outcome: Progressing Goal: Diagnostic test results will improve Outcome: Progressing Goal: Respiratory complications will improve Outcome: Progressing Goal: Cardiovascular complication will be avoided Outcome: Progressing   Problem: Pain Managment: Goal: General experience of comfort will improve Outcome: Progressing   Pt is involved in and agrees with the plan of care. V/S stable. Reports minimal surgical pain; scheduled tylenol and toradol given with relief. Dressing on abd dry and intact. FC in place; draining well.

## 2020-08-10 NOTE — Progress Notes (Signed)
[facility] SURGICAL PROGRESS NOTE  Hospital Day(s): 1.   Post op day(s): 1 Day Post-Op.   Interval History:  Patient seen and examined No acute events or new complaints overnight.  Patient reports she is sore but "feeling better than yesterday" No fever, chills, nausea, emesis She does have a leukocytosis this morning to 17.8K; likely reactive from OR Renal function normal; sCr - 0.80; UO - 350 ccs No electrolyte derangements She just received her CLD breakfast She was able to stand independently yesterday but did not ambulate    Vital signs in last 24 hours: [min-max] current  Temp:  [97 F (36.1 C)-99 F (37.2 C)] 99 F (37.2 C) (05/25 0329) Pulse Rate:  [72-101] 81 (05/25 0329) Resp:  [14-30] 18 (05/25 0329) BP: (124-152)/(59-83) 124/59 (05/25 0329) SpO2:  [95 %-100 %] 97 % (05/25 0329) Weight:  [77.3 kg] 77.3 kg (05/24 1130)     Height: 5\' 1"  (154.9 cm) Weight: 77.3 kg BMI (Calculated): 32.22   Intake/Output last 2 shifts:  05/24 0701 - 05/25 0700 In: 1620.5 [P.O.:80; I.V.:1340.5; IV Piggyback:200] Out: 370 [Urine:350; Blood:20]   Physical Exam:  Constitutional: alert, cooperative and no distress  Respiratory: breathing non-labored at rest  Cardiovascular: regular rate and sinus rhythm  Gastrointestinal: Soft, non-tender, non-distended, no rebound or guarding Integumentary: Laparoscopic incisions are CDI with dermabond, some ecchymosis. Midline wound, which is healing via secondary intention, wound bed is 100% granulation tissue, wicks present. Previous colostomy site is approximated but skin left open, wicks in wound bed.   Labs:  CBC Latest Ref Rng & Units 08/10/2020 08/08/2020 04/01/2020  WBC 4.0 - 10.5 K/uL 17.8(H) 8.0 11.9(H)  Hemoglobin 12.0 - 15.0 g/dL 04/03/2020 34.1 10.2(L)  Hematocrit 36.0 - 46.0 % 37.9 42.8 31.2(L)  Platelets 150 - 400 K/uL 237 228 213   CMP Latest Ref Rng & Units 08/10/2020 04/01/2020 03/31/2020  Glucose 70 - 99 mg/dL 04/02/2020)  229(N) 989(Q)  BUN 8 - 23 mg/dL 16 15 18   Creatinine 0.44 - 1.00 mg/dL 119(E 1.74  Sodium 135 - 145 mmol/L 136 145 147(H)  Potassium 3.5 - 5.1 mmol/L 4.4 3.6 4.0  Chloride 98 - 111 mmol/L 106 112(H) 112(H)  CO2 22 - 32 mmol/L 22 26 27   Calcium 8.9 - 10.3 mg/dL 0.81) 7.6(L) 8.0(L)  Total Protein 6.5 - 8.1 g/dL - - -  Total Bilirubin 0.3 - 1.2 mg/dL - - -  Alkaline Phos 38 - 126 U/L - - -  AST 15 - 41 U/L - - -  ALT 0 - 44 U/L - - -     Imaging studies: No new pertinent imaging studies   Assessment/Plan: 65 y.o. female 1 Day Post-Op s/p laparoscopic colostomy takedown and appendectomy   - Continue CLD for now; okay to advance to full liquids if tolerating and doing well. Await bowel function return before advancing completely.   - Discontinue foley catheter  - Complete peri-operative ABx (Cefotetan)  - Monitor abdominal examination; on-going bowel function  - Pain control prn; antiemetics prn  - Monitor leukocytosis; likely reactive  - Mobilization encouraged    All of the above findings and recommendations were discussed with the patient, and the medical team, and all of patient's questions were answered to her expressed satisfaction.  -- , PA-C [facility] 08/10/2020, 7:47 AM [phone] M-F: 7am - 4pm

## 2020-08-10 NOTE — TOC Initial Note (Signed)
Transition of Care [facility]) - Initial/Assessment Note    Patient Details  Name: Monica Mckenzie MRN: [id number] Date of Birth: 03-13-1956  [facility]) CM/SW Contact:    Hetty Ely, RN Phone Number: 08/10/2020, 10:14 AM  Clinical Narrative:  Spoke with patient, husband at the bedside. Patient alert and oriented x4, says she lives in townhome with husband who assists with cooking and shopping. Use [facility], husband pick up medications and transports her to medical appointments. PCP Dr. Larwance Sachs at the [facility] Patient denies use of assisted devices, however do have a rolling walker that she received following last surgery procedure, it's in the attic and she can get it if needed. Patient states she did not have HH services following last surgery because of difficulty arranging, so husband was trained to do midline dressing changes and did a great job. Patient and husband says they are receptive to [facility] services if needed however husband feel he can take care of wife. Patient says she has two kids and grandkids that lives nearby. Husband will transport home when discharge.  TOC will continue to track for discharge needs.                   Expected Discharge Plan: Home/Self Care Barriers to Discharge: Continued Medical Work up   Patient Goals and CMS Choice Patient states their goals for this hospitalization and ongoing recovery are:: To return home with husband.      Expected Discharge Plan and Services Expected Discharge Plan: Home/Self Care       Living arrangements for the past 2 months: Apartment                                      Prior Living Arrangements/Services Living arrangements for the past 2 months: Apartment Lives with:: Spouse Patient language and need for interpreter reviewed:: Yes Do you feel safe going back to the place where you live?: Yes      Need for Family Participation in Patient Care: Yes (Comment) Care giver  support system in place?: Yes (comment)   Criminal Activity/Legal Involvement Pertinent to Current Situation/Hospitalization: No - Comment as needed  Activities of Daily Living Home Assistive Devices/Equipment: None ADL Screening (condition at time of admission) Patient's cognitive ability adequate to safely complete daily activities?: Yes Is the patient deaf or have difficulty hearing?: No Does the patient have difficulty seeing, even when wearing glasses/contacts?: No Does the patient have difficulty concentrating, remembering, or making decisions?: No Patient able to express need for assistance with ADLs?: Yes Does the patient have difficulty dressing or bathing?: No Independently performs ADLs?: Yes (appropriate for developmental age) Does the patient have difficulty walking or climbing stairs?: No Weakness of Legs: None Weakness of Arms/Hands: None  Permission Sought/Granted                  Emotional Assessment Appearance:: Appears stated age Attitude/Demeanor/Rapport: Engaged Affect (typically observed): Accepting Orientation: : Oriented to Self,Oriented to Place,Oriented to  Time,Oriented to Situation Alcohol / Substance Use: Not Applicable Psych Involvement: No (comment)  Admission diagnosis:  S/P colostomy takedown [Q65.784] Patient Active Problem List   Diagnosis Date Noted  . S/P colostomy takedown 08/09/2020  . Diverticulitis of colon   . Polyp of transverse colon   . Diverticulitis of large intestine with perforation without bleeding 03/29/2020  . Encounter to establish care with new  doctor 03/03/2015   PCP:  Kandyce Rud, MD Pharmacy:   [facility] [place] [place] - [address] [place] [address] Phone: [phone] Fax: [phone]     Social Determinants of Health (SDOH) Interventions    Readmission Risk Interventions No flowsheet data found.

## 2020-08-10 NOTE — Progress Notes (Signed)
Mobility Specialist - Progress Note   08/10/20 1100  Mobility  Activity Ambulated in hall  Level of Assistance Minimal assist, patient does 75% or more  Assistive Device None (2-hand assist)  Distance Ambulated (ft) 50 ft  Mobility Ambulated with assistance in room  Mobility Response Tolerated well  Mobility performed by Mobility specialist  $Mobility charge 1 Mobility    Pt ambulated in room with 2-hand assist. No LOB. Pt educated on log-rolling technique to minimize pain. MinA and extra time to stand, denied dizziness. Pt denied SOB on RA. Does voice fatigue during ambulation, limiting further activity. Pt returned to bed, minA for LE support. RPE 4/10 with entering/exiting bed reported as most difficult part.    Filiberto Pinks Mobility Specialist 08/10/20, 11:27 AM

## 2020-08-11 LAB — CBC
HCT: 34.1 % — ABNORMAL LOW (ref 36.0–46.0)
Hemoglobin: 11.4 g/dL — ABNORMAL LOW (ref 12.0–15.0)
MCH: 27.6 pg (ref 26.0–34.0)
MCHC: 33.4 g/dL (ref 30.0–36.0)
MCV: 82.6 fL (ref 80.0–100.0)
Platelets: 160 10*3/uL (ref 150–400)
RBC: 4.13 MIL/uL (ref 3.87–5.11)
RDW: 16.1 % — ABNORMAL HIGH (ref 11.5–15.5)
WBC: 13 10*3/uL — ABNORMAL HIGH (ref 4.0–10.5)
nRBC: 0 % (ref 0.0–0.2)

## 2020-08-11 LAB — BASIC METABOLIC PANEL
Anion gap: 7 (ref 5–15)
BUN: 14 mg/dL (ref 8–23)
CO2: 22 mmol/L (ref 22–32)
Calcium: 8.5 mg/dL — ABNORMAL LOW (ref 8.9–10.3)
Chloride: 108 mmol/L (ref 98–111)
Creatinine, Ser: 0.68 mg/dL (ref 0.44–1.00)
GFR, Estimated: >60 mL/min (ref >60)
Glucose, Bld: 98 mg/dL (ref 70–99)
Potassium: 4.4 mmol/L (ref 3.5–5.1)
Sodium: 137 mmol/L (ref 135–145)

## 2020-08-11 LAB — SURGICAL PATHOLOGY

## 2020-08-11 MED ORDER — OXYCODONE HCL 5 MG PO TABS: 5.0000 mg | ORAL_TABLET | ORAL | 0 refills | Status: DC | PRN

## 2020-08-11 MED ORDER — ACETAMINOPHEN 500 MG PO TABS: 1000.0000 mg | ORAL_TABLET | Freq: Four times a day (QID) | ORAL | 0 refills | Status: AC

## 2020-08-11 MED ORDER — PREGABALIN 100 MG PO CAPS: 100.0000 mg | ORAL_CAPSULE | Freq: Three times a day (TID) | ORAL | 0 refills | Status: DC

## 2020-08-11 NOTE — Discharge Summary (Signed)
Patient ID: Monica Mckenzie MRN: [id number] DOB/AGE: 1955/12/31 65 y.o.  Admit date: 08/09/2020 Discharge date: 08/11/2020   Discharge Diagnoses:  Active Problems:   S/P colostomy takedown   Procedures: Robotic colostomy takedown  Hospital Course: 65 year old female with history of Hartman's procedure now for colostomy reversal.  She underwent an uneventful laparoscopic colostomy takedown.  Patient was kept for two nights.  Diet was slowly advance and she tolerated it well. At the time of discharge the patient was ambulating,  pain was controlled and she was passing flatus.  Her vital signs were stable and she was afebrile.   physical exam at discharge showed a pt  in no acute distress.  Awake and alert.  Abdomen: Soft incisions healing well without infection or peritonitis.semi open wounds healing well.  Extremities well-perfused and no edema.  Condition of the patient the time of discharge was stable  Disposition: Discharge disposition: 01-Home or Self Care       Discharge Instructions    Call MD for:  difficulty breathing, headache or visual disturbances   Complete by: As directed    Call MD for:  extreme fatigue   Complete by: As directed    Call MD for:  hives   Complete by: As directed    Call MD for:  persistant dizziness or light-headedness   Complete by: As directed    Call MD for:  persistant nausea and vomiting   Complete by: As directed    Call MD for:  redness, tenderness, or signs of infection (pain, swelling, redness, odor or green/yellow discharge around incision site)   Complete by: As directed    Call MD for:  severe uncontrolled pain   Complete by: As directed    Call MD for:  temperature >100.4   Complete by: As directed    Diet - low sodium heart healthy   Complete by: As directed    Discharge instructions   Complete by: As directed    May take sponge bath until f/u appt. Change dressing daily telfa followed by dry gauze and tape   Discharge wound  care:   Complete by: As directed    Change dressing daily telfa followed by dry gauze and tape   Increase activity slowly   Complete by: As directed    Lifting restrictions   Complete by: As directed    20 lbs x 6wks     Allergies as of 08/11/2020   No Known Allergies     Medication List    TAKE these medications   acetaminophen 500 MG tablet Commonly known as: TYLENOL Take 500 mg by mouth every 6 (six) hours as needed for moderate pain or headache. What changed: Another medication with the same name was added. Make sure you understand how and when to take each.   acetaminophen 500 MG tablet Commonly known as: TYLENOL Take 2 tablets (1,000 mg total) by mouth every 6 (six) hours for 7 days. What changed: You were already taking a medication with the same name, and this prescription was added. Make sure you understand how and when to take each.   AZO CRANBERRY URINARY TRACT PO Take 2 tablets by mouth daily.   CALCIUM 600+D PLUS MINERALS PO Take 1 tablet by mouth daily.   CENTRUM SILVER ULTRA WOMENS PO Take 1 tablet by mouth daily.   COLLAGEN-VITAMIN C PO Take 4 tablets by mouth daily.   loperamide 2 MG tablet Commonly known as: Imodium A-D Take 2 tablets (4 mg total)  by mouth 4 (four) times daily as needed for diarrhea or loose stools. What changed:   how much to take  when to take this   metroNIDAZOLE 500 MG tablet Commonly known as: FLAGYL Take 2 tablets at 8 AM, take 2 tablets at 2 PM, and take 2 tablets at 8 PM the day prior to your surgery. What changed:   how much to take  how to take this  when to take this   neomycin 500 MG tablet Commonly known as: MYCIFRADIN Take 1 tablet (500 mg total) by mouth 3 (three) times daily. What changed:   how much to take  when to take this  additional instructions   oxyCODONE 5 MG immediate release tablet Commonly known as: Oxy IR/ROXICODONE Take 1 tablet (5 mg total) by mouth every 4 (four) hours as needed  for moderate pain or severe pain (may take two for severe pain).   polyethylene glycol 17 g packet Commonly known as: MIRALAX / GLYCOLAX Take 17 g by mouth daily as needed for moderate constipation.   pregabalin 100 MG capsule Commonly known as: LYRICA Take 1 capsule (100 mg total) by mouth 3 (three) times daily.            Discharge Care Instructions  (From admission, onward)         Start     Ordered   08/11/20 0000  Discharge wound care:       Comments: Change dressing daily telfa followed by dry gauze and tape   08/11/20 2542          Follow-up Information    Donovan Kail, PA-C. Go on 08/16/2020.   Specialty: Physician Assistant Why: 4:15pm appointment Contact information: [address] [place] [address] [phone]                Sterling Big, MD FACS

## 2020-08-11 NOTE — Progress Notes (Signed)
Mobility Specialist - Progress Note   08/11/20 1000  Mobility  Activity Refused mobility  Mobility performed by Mobility specialist    Pt politely declined mobility this date, states she just finished ambulating hallways with nursing staff. Reports feeling great, no pain. Still a little fatigue after ambulation, pt encouraged to continue to pace self during activity. No needs at this time. Pt anticipating d/c later this date.    Filiberto Pinks Mobility Specialist 08/11/20, 10:48 AM

## 2020-08-11 NOTE — TOC Transition Note (Signed)
Transition of Care [facility]) - CM/SW Discharge Note   Patient Details  Name: KAYCEE HAYCRAFT MRN: [id number] Date of Birth: 06-Dec-1955  Transition of [facility]) CM/SW Contact:  Margarito Liner, LCSW Phone Number: 08/11/2020, 8:53 AM   Clinical Narrative: Patient has orders to discharge home today. No further concerns. CSW signing off.    Final next level of care: Home/Self Care Barriers to Discharge: Barriers Resolved   Patient Goals and CMS Choice Patient states their goals for this hospitalization and ongoing recovery are:: To return home with husband.      Discharge Placement                    Patient and family notified of of transfer: 08/11/20  Discharge Plan and Services                                     Social Determinants of Health (SDOH) Interventions     Readmission Risk Interventions No flowsheet data found.

## 2020-08-11 NOTE — Anesthesia Postprocedure Evaluation (Signed)
Anesthesia Post Note  Patient: Monica Mckenzie  Procedure(s) Performed: XI ROBOTIC ASSISTED COLOSTOMY TAKEDOWN with RNFA to assist (N/A ) HERNIA REPAIR INCISIONAL (N/A ) APPENDECTOMY LAPAROSCOPIC  Patient location during evaluation: PACU Anesthesia Type: General Level of consciousness: awake and alert Pain management: pain level controlled Vital Signs Assessment: post-procedure vital signs reviewed and stable Respiratory status: spontaneous breathing, nonlabored ventilation, respiratory function stable and patient connected to nasal cannula oxygen Cardiovascular status: blood pressure returned to baseline and stable Postop Assessment: no apparent nausea or vomiting Anesthetic complications: no   No complications documented.   Last Vitals:  Vitals:   08/11/20 0450 08/11/20 0726  BP: 113/66 123/64  Pulse: 70 67  Resp: 20 20  Temp: 36.6 C 37.2 C  SpO2: 97% 99%    Last Pain:  Vitals:   08/11/20 0726  TempSrc: Oral  PainSc:                  Cleda Mccreedy Azlin Zilberman

## 2020-08-16 ENCOUNTER — Encounter: Payer: Self-pay | Admitting: Physician Assistant

## 2020-08-16 ENCOUNTER — Ambulatory Visit (INDEPENDENT_AMBULATORY_CARE_PROVIDER_SITE_OTHER): Payer: Medicare HMO | Admitting: Physician Assistant

## 2020-08-16 ENCOUNTER — Other Ambulatory Visit: Payer: Self-pay

## 2020-08-16 VITALS — BP 115/73 | HR 76 | Temp 98.2°F

## 2020-08-16 DIAGNOSIS — Z8719 Personal history of other diseases of the digestive system: Secondary | ICD-10-CM

## 2020-08-16 DIAGNOSIS — Z09 Encounter for follow-up examination after completed treatment for conditions other than malignant neoplasm: Secondary | ICD-10-CM

## 2020-08-16 NOTE — Patient Instructions (Signed)
Please see your follow up appointment listed below.  °

## 2020-08-16 NOTE — Progress Notes (Signed)
[facility] POST-OP OFFICE VISIT  08/16/2020  HPI: Monica Mckenzie is a 65 y.o. female 7 days s/p robotic assisted laparoscopic colostomy takedown with appendectomy and incisional hernia repair with Dr Everlene Farrier  She is doing okay at home Most of her pain is over her previous colostomy site, worse with movement. She feels better when relaxing Only using tylenol and lyrica for pain No fever, chills, nausea, emesis She is tolerating a soft diet at home Having bowel function; looser stool but no diarrhea  Vital signs: BP 115/73   Pulse 76   Temp 98.2 F (36.8 C)   SpO2 96%    Physical Exam: Constitutional: Well appearing female, NAD Abdomen: Soft, incision soreness, non-distended, no rebound/guarding Skin: laparoscopic incisions are healing well, there is ecchymosis present. No erythema or drainage. Midline wound continues to heal via secondary intention, wound bed is 100% granulation tissue. Previous colostomy site is healing well, wound depth is minimal now <5 mm, wound bed appears healthy, no erythema or drainage  Assessment/Plan: This is a 65 y.o. female 7 days s/p robotic assisted laparoscopic colostomy takedown with appendectomy and incisional hernia repair   - Encouraged her to add ibuprofen +/- ice packs to her current pain regimen  - Reviewed wound care instructions; husband is well versed in this  - Reviewed lifting restrictions; 6 weeks total  - Reviewed pathology; Normal appendix  - Will check on her again in 1 week before moving to more lengthy follow up   -- Lynden Oxford, PA-C [facility] 08/16/2020, 3:19 PM [phone] M-F: 7am - 4pm

## 2020-08-24 ENCOUNTER — Other Ambulatory Visit: Payer: Self-pay

## 2020-08-24 ENCOUNTER — Ambulatory Visit (INDEPENDENT_AMBULATORY_CARE_PROVIDER_SITE_OTHER): Payer: Medicare HMO | Admitting: Surgery

## 2020-08-24 ENCOUNTER — Encounter: Payer: Self-pay | Admitting: Surgery

## 2020-08-24 VITALS — BP 120/66 | HR 77 | Temp 97.8°F | Ht 61.0 in | Wt 167.0 lb

## 2020-08-24 DIAGNOSIS — Z09 Encounter for follow-up examination after completed treatment for conditions other than malignant neoplasm: Secondary | ICD-10-CM

## 2020-08-24 NOTE — Patient Instructions (Addendum)
Please continue to wash the area with soap and water. Place dressing over the wound and secure with tape. Increase protein rich foods. Please see your follow up appointment listed below.

## 2020-08-25 NOTE — Progress Notes (Signed)
Monica Mckenzie is 2 weeks out following laparoscopic colostomy takedown.  She has done remarkably well.  No fevers no chills some soreness.  She is eating and having bowel movements.  Physical exam: No acute distress.  Awake and alert. Abdomen: Soft nontender wounds healing very well with granulation tissue.  No evidence of infection.  No peritonitis.  No hernias.   A/P doing very well following colostomy takedown.  No complications.  Will we will see her back in a few months for a final wound check.

## 2020-09-14 ENCOUNTER — Encounter: Payer: Self-pay | Admitting: Surgery

## 2020-09-14 ENCOUNTER — Ambulatory Visit (INDEPENDENT_AMBULATORY_CARE_PROVIDER_SITE_OTHER): Payer: Medicare HMO | Admitting: Surgery

## 2020-09-14 ENCOUNTER — Other Ambulatory Visit: Payer: Self-pay

## 2020-09-14 VITALS — BP 118/84 | HR 102 | Temp 98.1°F | Ht 61.0 in | Wt 165.6 lb

## 2020-09-14 DIAGNOSIS — Z1231 Encounter for screening mammogram for malignant neoplasm of breast: Secondary | ICD-10-CM

## 2020-09-14 NOTE — Patient Instructions (Addendum)
You may gradually add in more activity as tolerated. You may try adding fiber supplements to your diet to help with any loose stools. (Metamucil or Benefiber)   Take your bandage off before you shower. Rinse well and pat dry and reapply dressing.  Keep up with daily dressing changes. The wounds will continue to close from the outside in.   You may eat what ever you want.   Call [facility] to schedule your mammogram [phone].  Follow up here in 2 months.

## 2020-09-15 NOTE — Progress Notes (Signed)
65 year old status post robotic colostomy takedown.  Doing very well.  No fevers no chills.  Tolerating regular diet and having bowel movements.  PE NAD ABD: soft, nt, midline wound w good granulation tissue almost completely healed.   A/P Doing very well w/o complications RTC 3 months

## 2020-09-23 DIAGNOSIS — Z79899 Other long term (current) drug therapy: Secondary | ICD-10-CM | POA: Diagnosis not present

## 2020-09-23 DIAGNOSIS — Z0000 Encounter for general adult medical examination without abnormal findings: Secondary | ICD-10-CM | POA: Diagnosis not present

## 2020-10-06 DIAGNOSIS — H2513 Age-related nuclear cataract, bilateral: Secondary | ICD-10-CM | POA: Diagnosis not present

## 2020-10-11 ENCOUNTER — Other Ambulatory Visit: Payer: Self-pay

## 2020-10-11 ENCOUNTER — Ambulatory Visit
Admission: RE | Admit: 2020-10-11 | Discharge: 2020-10-11 | Disposition: A | Discharge status: Home / Self Care | Payer: Medicare HMO | Source: Ambulatory Visit | Attending: Surgery | Admitting: Surgery

## 2020-10-11 DIAGNOSIS — Z1231 Encounter for screening mammogram for malignant neoplasm of breast: Secondary | ICD-10-CM | POA: Diagnosis not present

## 2020-10-13 ENCOUNTER — Telehealth: Payer: Self-pay

## 2020-10-13 NOTE — Telephone Encounter (Signed)
Spoke with patient -normal mammogram per Dr.Pabon -reminded of her f/u appointment in August-patient verbalized understanding.

## 2020-10-13 NOTE — Telephone Encounter (Signed)
error 

## 2020-11-07 ENCOUNTER — Other Ambulatory Visit: Payer: Self-pay

## 2020-11-07 ENCOUNTER — Other Ambulatory Visit
Admission: RE | Admit: 2020-11-07 | Discharge: 2020-11-07 | Disposition: A | Discharge status: Home / Self Care | Payer: Medicare HMO | Source: Ambulatory Visit | Attending: Surgery | Admitting: Surgery

## 2020-11-07 ENCOUNTER — Ambulatory Visit (INDEPENDENT_AMBULATORY_CARE_PROVIDER_SITE_OTHER): Payer: Medicare HMO | Admitting: Surgery

## 2020-11-07 ENCOUNTER — Telehealth: Payer: Self-pay

## 2020-11-07 ENCOUNTER — Encounter: Payer: Self-pay | Admitting: Surgery

## 2020-11-07 VITALS — BP 127/71 | HR 96 | Temp 98.3°F | Ht 61.0 in | Wt 164.8 lb

## 2020-11-07 DIAGNOSIS — Z8719 Personal history of other diseases of the digestive system: Secondary | ICD-10-CM

## 2020-11-07 DIAGNOSIS — K269 Duodenal ulcer, unspecified as acute or chronic, without hemorrhage or perforation: Secondary | ICD-10-CM | POA: Diagnosis not present

## 2020-11-07 DIAGNOSIS — R1084 Generalized abdominal pain: Secondary | ICD-10-CM | POA: Diagnosis not present

## 2020-11-07 DIAGNOSIS — R1013 Epigastric pain: Secondary | ICD-10-CM | POA: Diagnosis not present

## 2020-11-07 LAB — CBC WITH DIFFERENTIAL/PLATELET
Abs Immature Granulocytes: 0.02 10*3/uL (ref 0.00–0.07)
Basophils Absolute: 0.1 10*3/uL (ref 0.0–0.1)
Basophils Relative: 1 %
Eosinophils Absolute: 0.2 10*3/uL (ref 0.0–0.5)
Eosinophils Relative: 3 %
HCT: 43.5 % (ref 36.0–46.0)
Hemoglobin: 14.6 g/dL (ref 12.0–15.0)
Immature Granulocytes: 0 %
Lymphocytes Relative: 23 %
Lymphs Abs: 1.8 10*3/uL (ref 0.7–4.0)
MCH: 30 pg (ref 26.0–34.0)
MCHC: 33.6 g/dL (ref 30.0–36.0)
MCV: 89.5 fL (ref 80.0–100.0)
Monocytes Absolute: 0.7 10*3/uL (ref 0.1–1.0)
Monocytes Relative: 8 %
Neutro Abs: 5.1 10*3/uL (ref 1.7–7.7)
Neutrophils Relative %: 65 %
Platelets: 232 10*3/uL (ref 150–400)
RBC: 4.86 MIL/uL (ref 3.87–5.11)
RDW: 14.3 % (ref 11.5–15.5)
WBC: 7.9 10*3/uL (ref 4.0–10.5)
nRBC: 0 % (ref 0.0–0.2)

## 2020-11-07 LAB — COMPREHENSIVE METABOLIC PANEL
ALT: 23 U/L (ref 0–44)
AST: 27 U/L (ref 15–41)
Albumin: 4 g/dL (ref 3.5–5.0)
Alkaline Phosphatase: 58 U/L (ref 38–126)
Anion gap: 6 (ref 5–15)
BUN: 19 mg/dL (ref 8–23)
CO2: 26 mmol/L (ref 22–32)
Calcium: 9.5 mg/dL (ref 8.9–10.3)
Chloride: 109 mmol/L (ref 98–111)
Creatinine, Ser: 0.81 mg/dL (ref 0.44–1.00)
GFR, Estimated: >60 mL/min (ref >60)
Glucose, Bld: 117 mg/dL — ABNORMAL HIGH (ref 70–99)
Potassium: 4 mmol/L (ref 3.5–5.1)
Sodium: 141 mmol/L (ref 135–145)
Total Bilirubin: 0.9 mg/dL (ref 0.3–1.2)
Total Protein: 7.6 g/dL (ref 6.5–8.1)

## 2020-11-07 LAB — LIPASE, BLOOD: Lipase: 31 U/L (ref 11–51)

## 2020-11-07 LAB — PROTIME-INR
INR: 1.1 (ref 0.8–1.2)
Prothrombin Time: 14.4 seconds (ref 11.4–15.2)

## 2020-11-07 NOTE — Patient Instructions (Addendum)
We will get you scheduled for a CT scan and abdominal Ultrasound to check you abdomen and gallbladder.  We would like for you to get some lab work done. You may get this done at [facility], [address] at your convenience.    We will have you follow up here after these tests are done.   We will call you about these appointment.

## 2020-11-07 NOTE — Telephone Encounter (Signed)
Call to patient to give her information for imaging studies.  The patient is scheduled for an Ultrasound of the gallbladder at [facility], [place] entrance on 11/17/20. Arrival time is 8:00 am. She will have nothing to eat or drink after midnight the night prior.  The patient is scheduled for a CT scan at [facility], [place] entrance on 11/22/20. Arrival time is 12:30 pm. She will have nothing to eat or drink for 4 hours prior. She will need to pick up a prep kit for this scan a few days prior.  She will follow up in office after these results.

## 2020-11-08 NOTE — Progress Notes (Signed)
Outpatient Surgical Follow Up  11/08/2020  Monica Mckenzie is an 65 y.o. female.   Chief Complaint  Patient presents with   Routine Post Op    HPI: Monica Mckenzie is a 65 year old well-known to me with recent colostomy takedown approximately 3 months ago.  Now presents with epigastric area in the upper abdomen pain is intermittent, severe associated with certain meals.  No fevers no chills no vomiting.  No evidence of obstructive jaundice she is states that this is different from her diverticulitis pain.  Denies any melena. Last visit I did order a mammogram that have personally reviewed showing no evidence of any suspicious lesions.  She denies  Past Medical History:  Diagnosis Date   Allergy    Asthma    ALLERGY RELATED   Complication of anesthesia    NAUSEA   Family history of adverse reaction to anesthesia    FATHER HAS PONV    Past Surgical History:  Procedure Laterality Date   COLECTOMY WITH COLOSTOMY CREATION/HARTMANN PROCEDURE N/A 03/29/2020   Procedure: COLECTOMY WITH COLOSTOMY CREATION/HARTMANN PROCEDURE;  Surgeon: Leafy Ro, MD;  Location: [facility] ORS;  Service: General;  Laterality: N/A;   COLONOSCOPY  2012   cleared for 10 yrs- Dr Bluford Kaufmann   COLONOSCOPY WITH PROPOFOL N/A 07/12/2020   Procedure: COLONOSCOPY WITH PROPOFOL THROUGH COLOSTOMY;  Surgeon: Midge Minium, MD;  Location: [facility] ENDOSCOPY;  Service: Endoscopy;  Laterality: N/A;   DENTAL WORK     INCISIONAL HERNIA REPAIR N/A 08/09/2020   Procedure: HERNIA REPAIR INCISIONAL;  Surgeon: Leafy Ro, MD;  Location: [facility] ORS;  Service: General;  Laterality: N/A;   LAPAROSCOPIC APPENDECTOMY  08/09/2020   Procedure: APPENDECTOMY LAPAROSCOPIC;  Surgeon: Leafy Ro, MD;  Location: [facility] ORS;  Service: General;;   XI ROBOTIC ASSISTED COLOSTOMY TAKEDOWN N/A 08/09/2020   Procedure: XI ROBOTIC ASSISTED COLOSTOMY TAKEDOWN with RNFA to assist;  Surgeon: Leafy Ro, MD;  Location: [facility] ORS;  Service: General;  Laterality: N/A;     Family History  Problem Relation Age of Onset   Cancer Mother    Breast cancer Mother 5   Diabetes Father    Hypertension Father     Social History:  reports that she has quit smoking. She has never used smokeless tobacco. She reports current alcohol use. She reports that she does not use drugs.  Allergies: No Known Allergies  Medications reviewed.    ROS Full ROS performed and is otherwise negative other than what is stated in HPI   BP 127/71   Pulse 96   Temp 98.3 F (36.8 C)   Ht 5\' 1"  (1.549 m)   Wt 164 lb 12.8 oz (74.8 kg)   SpO2 97%   BMI 31.14 kg/m   Physical Exam Vitals and nursing note reviewed. Exam conducted with a chaperone present.  Constitutional:      General: She is not in acute distress.    Appearance: Normal appearance. She is not toxic-appearing.  Cardiovascular:     Rate and Rhythm: Normal rate and regular rhythm.     Heart sounds: No murmur heard. Pulmonary:     Effort: Pulmonary effort is normal. No respiratory distress.     Breath sounds: Normal breath sounds. No stridor.  Abdominal:     General: Abdomen is flat. There is no distension.     Palpations: Abdomen is soft. There is no mass.     Tenderness: There is abdominal tenderness. There is no guarding or rebound.  Hernia: No hernia is present.     Comments: Midline incision healed no evidence of fracture no evidence of obvious hernias.  There is diffuse tenderness especially in the epigastric area.  No peritonitis  Musculoskeletal:        General: No swelling or tenderness. Normal range of motion.     Cervical back: Normal range of motion and neck supple. No rigidity or tenderness.  Skin:    General: Skin is warm and dry.     Capillary Refill: Capillary refill takes less than 2 seconds.  Neurological:     General: No focal deficit present.     Mental Status: She is alert and oriented to person, place, and time.  Psychiatric:        Mood and Affect: Mood normal.         Behavior: Behavior normal.        Thought Content: Thought content normal.        Judgment: Judgment normal.       Assessment/Plan: 65 year old female well-known to me with prior history of diverticulitis status post Hartman's eventually ostomy takedown.  Now presents with epigastric pain that has been different from prior episodes of diverticulitis.  On physical exam there is no evidence of hernias but I will be potentially concerned about diverticulitis somewhere else within the colon biliary disease.  We will start work-up with a CT scan of the abdomen pelvis as well for right upper quadrant ultrasound.  There is no complications related to her prior surgery and please note that this abdominal pain is not related to her most recent operation.  Before doing any treatment I would like to assess her first and then revisit potential need for antibiotics or any other therapies   Greater than 50% of the 35 minutes  visit was spent in counseling/coordination of care   Sterling Big, MD [facility] General Surgeon

## 2020-11-09 ENCOUNTER — Other Ambulatory Visit: Payer: Self-pay

## 2020-11-09 ENCOUNTER — Encounter: Payer: Self-pay | Admitting: Emergency Medicine

## 2020-11-09 DIAGNOSIS — Z79899 Other long term (current) drug therapy: Secondary | ICD-10-CM

## 2020-11-09 DIAGNOSIS — J45909 Unspecified asthma, uncomplicated: Secondary | ICD-10-CM | POA: Diagnosis present

## 2020-11-09 DIAGNOSIS — Z8249 Family history of ischemic heart disease and other diseases of the circulatory system: Secondary | ICD-10-CM

## 2020-11-09 DIAGNOSIS — K2970 Gastritis, unspecified, without bleeding: Secondary | ICD-10-CM | POA: Diagnosis present

## 2020-11-09 DIAGNOSIS — Z833 Family history of diabetes mellitus: Secondary | ICD-10-CM

## 2020-11-09 DIAGNOSIS — K269 Duodenal ulcer, unspecified as acute or chronic, without hemorrhage or perforation: Principal | ICD-10-CM | POA: Diagnosis present

## 2020-11-09 DIAGNOSIS — Z20822 Contact with and (suspected) exposure to covid-19: Secondary | ICD-10-CM | POA: Diagnosis present

## 2020-11-09 DIAGNOSIS — Z87891 Personal history of nicotine dependence: Secondary | ICD-10-CM

## 2020-11-09 DIAGNOSIS — Z803 Family history of malignant neoplasm of breast: Secondary | ICD-10-CM

## 2020-11-09 DIAGNOSIS — Z9049 Acquired absence of other specified parts of digestive tract: Secondary | ICD-10-CM

## 2020-11-09 LAB — CBC
HCT: 41.4 % (ref 36.0–46.0)
Hemoglobin: 14.2 g/dL (ref 12.0–15.0)
MCH: 30.2 pg (ref 26.0–34.0)
MCHC: 34.3 g/dL (ref 30.0–36.0)
MCV: 88.1 fL (ref 80.0–100.0)
Platelets: 230 10*3/uL (ref 150–400)
RBC: 4.7 MIL/uL (ref 3.87–5.11)
RDW: 13.9 % (ref 11.5–15.5)
WBC: 7.9 10*3/uL (ref 4.0–10.5)
nRBC: 0 % (ref 0.0–0.2)

## 2020-11-09 NOTE — ED Triage Notes (Signed)
Pt to ED from home c/o epigastric pain x2 weeks getting worse yesterday intermittently.  Patient had hx of bowel perforation and colostomy in January with colostomy reversal in May by Dr. Everlene Farrier.  Denies n/v/d, pain is cramping, denies urinary changes.  Pt A&Ox4, chest rise even and unlabored, skin WNL, and in NAD at this time.

## 2020-11-10 ENCOUNTER — Encounter
Admission: EM | Disposition: A | Discharge status: Home / Self Care | Payer: Self-pay | Source: Home / Self Care | Attending: Internal Medicine

## 2020-11-10 ENCOUNTER — Inpatient Hospital Stay: Payer: Medicare HMO | Admitting: Certified Registered"

## 2020-11-10 ENCOUNTER — Inpatient Hospital Stay
Admission: EM | Admit: 2020-11-10 | Discharge: 2020-11-11 | DRG: 384 | Disposition: A | Discharge status: Home / Self Care | Payer: Medicare HMO | Attending: Internal Medicine | Admitting: Internal Medicine

## 2020-11-10 ENCOUNTER — Emergency Department: Payer: Medicare HMO

## 2020-11-10 ENCOUNTER — Encounter: Payer: Self-pay | Admitting: Internal Medicine

## 2020-11-10 DIAGNOSIS — Z803 Family history of malignant neoplasm of breast: Secondary | ICD-10-CM | POA: Diagnosis not present

## 2020-11-10 DIAGNOSIS — J45909 Unspecified asthma, uncomplicated: Secondary | ICD-10-CM | POA: Diagnosis not present

## 2020-11-10 DIAGNOSIS — R109 Unspecified abdominal pain: Secondary | ICD-10-CM | POA: Diagnosis not present

## 2020-11-10 DIAGNOSIS — K269 Duodenal ulcer, unspecified as acute or chronic, without hemorrhage or perforation: Secondary | ICD-10-CM | POA: Diagnosis not present

## 2020-11-10 DIAGNOSIS — Z9049 Acquired absence of other specified parts of digestive tract: Secondary | ICD-10-CM | POA: Diagnosis not present

## 2020-11-10 DIAGNOSIS — R1013 Epigastric pain: Secondary | ICD-10-CM

## 2020-11-10 DIAGNOSIS — J4520 Mild intermittent asthma, uncomplicated: Secondary | ICD-10-CM | POA: Diagnosis not present

## 2020-11-10 DIAGNOSIS — R1012 Left upper quadrant pain: Secondary | ICD-10-CM

## 2020-11-10 DIAGNOSIS — Z20822 Contact with and (suspected) exposure to covid-19: Secondary | ICD-10-CM | POA: Diagnosis not present

## 2020-11-10 DIAGNOSIS — Z87891 Personal history of nicotine dependence: Secondary | ICD-10-CM | POA: Diagnosis not present

## 2020-11-10 DIAGNOSIS — K2970 Gastritis, unspecified, without bleeding: Secondary | ICD-10-CM | POA: Diagnosis not present

## 2020-11-10 DIAGNOSIS — Z833 Family history of diabetes mellitus: Secondary | ICD-10-CM | POA: Diagnosis not present

## 2020-11-10 DIAGNOSIS — K7689 Other specified diseases of liver: Secondary | ICD-10-CM | POA: Diagnosis not present

## 2020-11-10 DIAGNOSIS — Z79899 Other long term (current) drug therapy: Secondary | ICD-10-CM | POA: Diagnosis not present

## 2020-11-10 DIAGNOSIS — Z8249 Family history of ischemic heart disease and other diseases of the circulatory system: Secondary | ICD-10-CM | POA: Diagnosis not present

## 2020-11-10 DIAGNOSIS — K760 Fatty (change of) liver, not elsewhere classified: Secondary | ICD-10-CM | POA: Diagnosis not present

## 2020-11-10 HISTORY — PX: ESOPHAGOGASTRODUODENOSCOPY (EGD) WITH PROPOFOL: SHX5813

## 2020-11-10 LAB — RESP PANEL BY RT-PCR (FLU A&B, COVID) ARPGX2
Influenza A by PCR: NEGATIVE
Influenza B by PCR: NEGATIVE
SARS Coronavirus 2 by RT PCR: NEGATIVE

## 2020-11-10 LAB — COMPREHENSIVE METABOLIC PANEL
ALT: 18 U/L (ref 0–44)
AST: 26 U/L (ref 15–41)
Albumin: 3.6 g/dL (ref 3.5–5.0)
Alkaline Phosphatase: 58 U/L (ref 38–126)
Anion gap: 9 (ref 5–15)
BUN: 11 mg/dL (ref 8–23)
CO2: 21 mmol/L — ABNORMAL LOW (ref 22–32)
Calcium: 9.5 mg/dL (ref 8.9–10.3)
Chloride: 108 mmol/L (ref 98–111)
Creatinine, Ser: 0.6 mg/dL (ref 0.44–1.00)
GFR, Estimated: >60 mL/min (ref >60)
Glucose, Bld: 114 mg/dL — ABNORMAL HIGH (ref 70–99)
Potassium: 4 mmol/L (ref 3.5–5.1)
Sodium: 138 mmol/L (ref 135–145)
Total Bilirubin: 0.7 mg/dL (ref 0.3–1.2)
Total Protein: 7.4 g/dL (ref 6.5–8.1)

## 2020-11-10 LAB — LIPASE, BLOOD: Lipase: 32 U/L (ref 11–51)

## 2020-11-10 LAB — TYPE AND SCREEN
ABO/RH(D): O POS
Antibody Screen: NEGATIVE

## 2020-11-10 LAB — APTT: aPTT: 30 seconds (ref 24–36)

## 2020-11-10 LAB — TROPONIN I (HIGH SENSITIVITY): Troponin I (High Sensitivity): 3 ng/L (ref <18)

## 2020-11-10 SURGERY — ESOPHAGOGASTRODUODENOSCOPY (EGD) WITH PROPOFOL
Anesthesia: General

## 2020-11-10 MED ORDER — PROPOFOL 10 MG/ML IV BOLUS
INTRAVENOUS | Status: AC
  Filled 2020-11-10: qty 40

## 2020-11-10 MED ORDER — LIDOCAINE HCL (CARDIAC) PF 100 MG/5ML IV SOSY
PREFILLED_SYRINGE | INTRAVENOUS | Status: DC | PRN
  Administered 2020-11-10: 25 mg via INTRAVENOUS

## 2020-11-10 MED ORDER — ALBUTEROL SULFATE HFA 108 (90 BASE) MCG/ACT IN AERS
2.0000 | INHALATION_SPRAY | RESPIRATORY_TRACT | Status: DC | PRN
  Filled 2020-11-10: qty 6.7

## 2020-11-10 MED ORDER — IOHEXOL 350 MG/ML SOLN
100.0000 mL | Freq: Once | INTRAVENOUS | Status: AC | PRN
  Administered 2020-11-10: 100 mL via INTRAVENOUS

## 2020-11-10 MED ORDER — FENTANYL CITRATE PF 50 MCG/ML IJ SOSY
50.0000 ug | PREFILLED_SYRINGE | Freq: Once | INTRAMUSCULAR | Status: AC
Start: 2020-11-10 — End: 2020-11-10
  Administered 2020-11-10: 50 ug via INTRAVENOUS
  Filled 2020-11-10: qty 1

## 2020-11-10 MED ORDER — PANTOPRAZOLE SODIUM 40 MG IV SOLR
40.0000 mg | Freq: Two times a day (BID) | INTRAVENOUS | Status: DC
  Administered 2020-11-10 – 2020-11-11 (×3): 40 mg via INTRAVENOUS
  Filled 2020-11-10 (×3): qty 40

## 2020-11-10 MED ORDER — ONDANSETRON 4 MG PO TBDP: 4.0000 mg | ORAL_TABLET | Freq: Three times a day (TID) | ORAL | 0 refills | Status: DC | PRN

## 2020-11-10 MED ORDER — PANTOPRAZOLE SODIUM 20 MG PO TBEC: 20.0000 mg | DELAYED_RELEASE_TABLET | Freq: Every day | ORAL | 1 refills | Status: DC

## 2020-11-10 MED ORDER — CALCIUM CARBONATE-VITAMIN D 500-200 MG-UNIT PO TABS
1.0000 | ORAL_TABLET | Freq: Every day | ORAL | Status: DC
  Administered 2020-11-10 – 2020-11-11 (×2): 1 via ORAL
  Filled 2020-11-10 (×2): qty 1

## 2020-11-10 MED ORDER — LIDOCAINE HCL (PF) 2 % IJ SOLN
INTRAMUSCULAR | Status: AC
  Filled 2020-11-10: qty 5

## 2020-11-10 MED ORDER — ASCORBIC ACID 500 MG PO TABS
250.0000 mg | ORAL_TABLET | Freq: Every day | ORAL | Status: DC
  Administered 2020-11-10 – 2020-11-11 (×2): 250 mg via ORAL
  Filled 2020-11-10 (×2): qty 1

## 2020-11-10 MED ORDER — SODIUM CHLORIDE 0.9 % IV SOLN: INTRAVENOUS | Status: DC

## 2020-11-10 MED ORDER — PROPOFOL 500 MG/50ML IV EMUL
INTRAVENOUS | Status: DC | PRN
  Administered 2020-11-10: 120 ug/kg/min via INTRAVENOUS

## 2020-11-10 MED ORDER — ONDANSETRON HCL 4 MG/2ML IJ SOLN
4.0000 mg | Freq: Once | INTRAMUSCULAR | Status: AC
  Administered 2020-11-10: 4 mg via INTRAVENOUS
  Filled 2020-11-10: qty 2

## 2020-11-10 MED ORDER — ONDANSETRON HCL 4 MG/2ML IJ SOLN: 4.0000 mg | Freq: Three times a day (TID) | INTRAMUSCULAR | Status: DC | PRN

## 2020-11-10 MED ORDER — PROPOFOL 10 MG/ML IV BOLUS
INTRAVENOUS | Status: AC
  Filled 2020-11-10: qty 20

## 2020-11-10 MED ORDER — PROPOFOL 10 MG/ML IV BOLUS
INTRAVENOUS | Status: DC | PRN
  Administered 2020-11-10: 70 mg via INTRAVENOUS
  Administered 2020-11-10: 30 mg via INTRAVENOUS

## 2020-11-10 MED ORDER — MORPHINE SULFATE (PF) 2 MG/ML IV SOLN
2.0000 mg | INTRAVENOUS | Status: DC | PRN
Start: 2020-11-10 — End: 2020-11-11

## 2020-11-10 MED ORDER — ACETAMINOPHEN 160 MG/5ML PO SOLN
650.0000 mg | Freq: Four times a day (QID) | ORAL | Status: DC | PRN
  Filled 2020-11-10: qty 20.3

## 2020-11-10 MED ORDER — ADULT MULTIVITAMIN W/MINERALS CH
1.0000 | ORAL_TABLET | Freq: Every day | ORAL | Status: DC
  Administered 2020-11-10 – 2020-11-11 (×2): 1 via ORAL
  Filled 2020-11-10 (×2): qty 1

## 2020-11-10 MED ORDER — DICYCLOMINE HCL 10 MG PO CAPS: 10.0000 mg | ORAL_CAPSULE | Freq: Four times a day (QID) | ORAL | 0 refills | Status: DC

## 2020-11-10 NOTE — ED Notes (Signed)
MD at the bedside  

## 2020-11-10 NOTE — Anesthesia Postprocedure Evaluation (Signed)
Anesthesia Post Note  Patient: Monica Mckenzie  Procedure(s) Performed: ESOPHAGOGASTRODUODENOSCOPY (EGD) WITH PROPOFOL  Patient location during evaluation: Endoscopy Anesthesia Type: General Level of consciousness: awake and alert Pain management: pain level controlled Vital Signs Assessment: post-procedure vital signs reviewed and stable Respiratory status: spontaneous breathing, nonlabored ventilation and respiratory function stable Cardiovascular status: blood pressure returned to baseline and stable Postop Assessment: no apparent nausea or vomiting Anesthetic complications: no   No notable events documented.   Last Vitals:  Vitals:   11/10/20 1643 11/10/20 1653  BP: (!) 112/55 (!) 126/48  Pulse: 63 65  Resp: 15 15  Temp: (!) 36.1 C   SpO2: 100% 100%    Last Pain:  Vitals:   11/10/20 1804  TempSrc:   PainSc: 0-No pain                 Foye Deer

## 2020-11-10 NOTE — Consult Note (Signed)
[facility] SURGICAL CONSULTATION NOTE (initial) - cpt: [phone]   HISTORY OF PRESENT ILLNESS (HPI):  65 y.o. female well known to our service for diverticulitis s/p Hartman's in January of this year and subsequent reversal around 3 months ago. She had been doing well in the outpatient setting until about 2 weeks ago. Now reporting intermittent severe epigastric and LUQ abdominal pain which seemed to be exacerbated with eating. She was seen in clinic on 08/22 by Dr Everlene Farrier, and this was not thought to be complication from her surgery. She presented to [facility] ED early this morning for evaluation of same. Patient reports she continues to have the similar sharp, intermittent abdominal pain which is markedly worse after eating. Typically, the pain may last a few hours but then resolve without intervention. There is accompanying nausea but otherwise no fever, chills, cough, CP, SOB, emesis, or bowel changes. PO intake has decreased secondary to the pain. She does report a history of taking Advil for 20 years "like they were skittles." No melena or hematochezia. Laboratory work up in the ED was reassuring. She did undergo CT Abdomen/pelvis which showed known post-surgical changes but no acute findings to explain her pain. She also underwent RUQ [place] which showed a very small amount of sludge vs stone but no gallbladder wall changes to suggest cholecystitis. She was ultimately admitted to the medicine service for epigastric pain of unknown origin.   Surgery is consulted by hospitalist physician Dr. Lorretta Harp, MD in this context for evaluation and management of epigastric/LUQ pain of unknown origin.   PAST MEDICAL HISTORY (PMH):  Past Medical History:  Diagnosis Date   Allergy    Asthma    ALLERGY RELATED   Complication of anesthesia    NAUSEA   Family history of adverse reaction to anesthesia    FATHER HAS PONV     PAST SURGICAL HISTORY (PSH):  Past Surgical History:  Procedure Laterality  Date   COLECTOMY WITH COLOSTOMY CREATION/HARTMANN PROCEDURE N/A 03/29/2020   Procedure: COLECTOMY WITH COLOSTOMY CREATION/HARTMANN PROCEDURE;  Surgeon: Leafy Ro, MD;  Location: [facility] ORS;  Service: General;  Laterality: N/A;   COLONOSCOPY  2012   cleared for 10 yrs- Dr Bluford Kaufmann   COLONOSCOPY WITH PROPOFOL N/A 07/12/2020   Procedure: COLONOSCOPY WITH PROPOFOL THROUGH COLOSTOMY;  Surgeon: Midge Minium, MD;  Location: [facility] ENDOSCOPY;  Service: Endoscopy;  Laterality: N/A;   DENTAL WORK     INCISIONAL HERNIA REPAIR N/A 08/09/2020   Procedure: HERNIA REPAIR INCISIONAL;  Surgeon: Leafy Ro, MD;  Location: [facility] ORS;  Service: General;  Laterality: N/A;   LAPAROSCOPIC APPENDECTOMY  08/09/2020   Procedure: APPENDECTOMY LAPAROSCOPIC;  Surgeon: Leafy Ro, MD;  Location: [facility] ORS;  Service: General;;   XI ROBOTIC ASSISTED COLOSTOMY TAKEDOWN N/A 08/09/2020   Procedure: XI ROBOTIC ASSISTED COLOSTOMY TAKEDOWN with RNFA to assist;  Surgeon: Leafy Ro, MD;  Location: [facility] ORS;  Service: General;  Laterality: N/A;     MEDICATIONS:  Prior to Admission medications   Medication Sig Start Date End Date Taking? Authorizing Provider  dicyclomine (BENTYL) 10 MG capsule Take 1 capsule (10 mg total) by mouth 4 (four) times daily for 14 days. 11/10/20 11/24/20 Yes Veronese, [place], MD  ondansetron (ZOFRAN ODT) 4 MG disintegrating tablet Take 1 tablet (4 mg total) by mouth every 8 (eight) hours as needed. 11/10/20  Yes Don Perking, [place], MD  pantoprazole (PROTONIX) 20 MG tablet Take 1 tablet (20 mg total) by mouth daily. 11/10/20 11/10/21 Yes Don Perking,  [place], MD  acetaminophen (TYLENOL) 500 MG tablet Take 500 mg by mouth every 6 (six) hours as needed for moderate pain or headache.    [provider]  Calcium Carbonate-Vit D-Min (CALCIUM 600+D PLUS MINERALS PO) Take 1 tablet by mouth daily.    [provider]  COLLAGEN-VITAMIN C PO Take 4 tablets by mouth daily.    [provider]   Cranberry-Vitamin C (AZO CRANBERRY URINARY TRACT PO) Take 2 tablets by mouth daily.    [provider]  Multiple Vitamins-Minerals (CENTRUM SILVER ULTRA WOMENS PO) Take 1 tablet by mouth daily.    [provider]     ALLERGIES:  No Known Allergies   SOCIAL HISTORY:  Social History   Socioeconomic History   Marital status: Married    Spouse name: Not on file   Number of children: Not on file   Years of education: Not on file   Highest education level: Not on file  Occupational History   Not on file  Tobacco Use   Smoking status: Former   Smokeless tobacco: Never  Vaping Use   Vaping Use: Never used  Substance and Sexual Activity   Alcohol use: Not Currently    Comment: EVERY 3 MONTHS   Drug use: No   Sexual activity: Yes  Other Topics Concern   Not on file  Social History Narrative   Not on file   Social Determinants of Health   Financial Resource Strain: Not on file  Food Insecurity: Not on file  Transportation Needs: Not on file  Physical Activity: Not on file  Stress: Not on file  Social Connections: Not on file  Intimate Partner Violence: Not on file     FAMILY HISTORY:  Family History  Problem Relation Age of Onset   Cancer Mother    Breast cancer Mother 76   Diabetes Father    Hypertension Father       REVIEW OF SYSTEMS:  Review of Systems  Constitutional:  Negative for chills and fever.  HENT:  Negative for congestion and sore throat.   Respiratory:  Negative for cough and shortness of breath.   Cardiovascular:  Negative for chest pain and palpitations.  Gastrointestinal:  Positive for abdominal pain and nausea. Negative for blood in stool, constipation, diarrhea, melena and vomiting.  Genitourinary:  Negative for dysuria and urgency.  All other systems reviewed and are negative.  VITAL SIGNS:  Temp:  [97.8 F (36.6 C)] 97.8 F (36.6 C) (08/24 2259) Pulse Rate:  [66-90] 67 (08/25 0800) Resp:  [15-20] 16 (08/25 0800) BP:  (112-136)/(64-96) 123/66 (08/25 0800) SpO2:  [97 %-100 %] 100 % (08/25 0800) Weight:  [73.9 kg] 73.9 kg (08/24 2257)     Height: 5\' 1"  (154.9 cm) Weight: 73.9 kg BMI (Calculated): 30.81   INTAKE/OUTPUT:  No intake/output data recorded.  PHYSICAL EXAM:  Physical Exam Vitals and nursing note reviewed. Exam conducted with a chaperone present.  Constitutional:      General: She is not in acute distress.    Appearance: She is well-developed. She is obese. She is not ill-appearing.  HENT:     Head: Normocephalic and atraumatic.  Eyes:     General: No scleral icterus.    Extraocular Movements: Extraocular movements intact.  Cardiovascular:     Rate and Rhythm: Normal rate and regular rhythm.     Heart sounds: Normal heart sounds. No murmur heard. Pulmonary:     Effort: Pulmonary effort is normal. No respiratory distress.  Breath sounds: Normal breath sounds.  Abdominal:     General: A surgical scar is present. There is no distension.     Palpations: Abdomen is soft.     Tenderness: There is abdominal tenderness in the epigastric area and left upper quadrant. There is no guarding or rebound. Negative signs include Murphy's sign.       Comments: Abdomen is soft, mild soreness in epigastrium and LUQ, non-distended, no rebound/guarding. Murphy's Sign is negative. No evidence of peritonitis.   Genitourinary:    Comments: Deferred Skin:    General: Skin is warm and dry.     Coloration: Skin is not jaundiced or pale.  Neurological:     General: No focal deficit present.     Mental Status: She is alert and oriented to person, place, and time.  Psychiatric:        Mood and Affect: Mood normal.        Behavior: Behavior normal.     Labs:  CBC Latest Ref Rng & Units 11/09/2020 11/07/2020 08/11/2020  WBC 4.0 - 10.5 K/uL 7.9 7.9 13.0(H)  Hemoglobin 12.0 - 15.0 g/dL 78.214.2 95.614.6 11.4(L)  Hematocrit 36.0 - 46.0 % 41.4 43.5 34.1(L)  Platelets 150 - 400 K/uL 230 232 160   CMP Latest Ref Rng  & Units 11/09/2020 11/07/2020 08/11/2020  Glucose 70 - 99 mg/dL 213(Y114(H) 865(H117(H) 98  BUN 8 - 23 mg/dL 11 19 14   Creatinine 0.44 - 1.00 mg/dL 8.460.60 9.620.81 9.520.68  Sodium 135 - 145 mmol/L 138 141 137  Potassium 3.5 - 5.1 mmol/L 4.0 4.0 4.4  Chloride 98 - 111 mmol/L 108 109 108  CO2 22 - 32 mmol/L 21(L) 26 22  Calcium 8.9 - 10.3 mg/dL 9.5 9.5 8.4(X8.5(L)  Total Protein 6.5 - 8.1 g/dL 7.4 7.6 -  Total Bilirubin 0.3 - 1.2 mg/dL 0.7 0.9 -  Alkaline Phos 38 - 126 U/L 58 58 -  AST 15 - 41 U/L 26 27 -  ALT 0 - 44 U/L 18 23 -     Imaging studies:   CT Abdomen/Pelvis (11/10/2020) personally reviewed with known surgical changes otherwise no obvious etiology identified to explain symptoms, and radiologist report reviewed below:  IMPRESSION: 1. Postoperative changes since January of appendectomy, distal partial colectomy, colostomy and colostomy reversal. No bowel obstruction or inflammation. However, the cecum is now inseparable from the right adnexa and uterine fundus, and might be adhesed. There is no regional inflammation or gas within the endometrial cavity, but there is vaginal gas. If there is unexplained vaginal discharge then consider the possibility of a fistula.   2. No other acute or inflammatory process identified in the abdomen or pelvis. Stable benign appearing hepatic and splenic cysts and/or hemangiomas. Mild aortic atherosclerosis.   RUQ US (11/10/2020) personally reviewed which shows a very small area of sludge vs small stone without gallbladder wall changes, no evidence of cholecystitis, and radiologist report reviewed below:  IMPRESSION: 1. Evidence of gallbladder Adenomyomatosis. And there is a small focus of adherent sludge versus nonshadowing stone. But no evidence of acute cholecystitis. 2. No evidence of bile duct obstruction. Benign appearing hepatic cysts.     Assessment/Plan: (ICD-10's: R10.13) 65 y.o. female with presenting with epigastric and LUQ abdominal pain of  unknown etiology. I have a higher suspicion for possible gastric/peptic ulcer disease given her presenting symptoms which are exacerbated with eating and history of NSAID use. Gallbladder was also considered but her US is reassuring and she is without leukocytosis or any  RUQ tenderness on examination. Unlikely any surgical complication in regards to her recent surgeries.    - Appreciate medicine admission  - Recommend involving GI to evaluate for possible EGD to rule out ulcer disease  - I will start PPI; Pantoprazole 40 mg IV BID  - NPO for now - Monitor abdominal examination - Pain control prn - No surgical intervention warranted at this time   - further management per primary service   All of the above findings and recommendations were discussed with the patient and her husband at bedside, and all of their questions were answered to their expressed satisfaction.  Thank you for the opportunity to participate in this patient's care.   -- Lynden Oxford, PA-C [facility] 11/10/2020, 10:32 AM [phone] M-F: 7am - 4pm

## 2020-11-10 NOTE — H&P (Signed)
History and Physical    Monica Mckenzie BJS:[id number] DOB: November 19, 1955 DOA: 11/10/2020  Referring MD/NP/PA:   PCP: Kandyce Rud, MD   Patient coming from:  The patient is coming from home.  At baseline, pt is independent for most of ADL.        Chief Complaint: Abdominal pain  HPI: Monica Mckenzie is a 65 y.o. female with medical history significant of asthma, allergy, diverticulitis with perforation, s/p of a partial colectomy, s/p of colostomy takedown, who presents with abdominal pain.  Patient states that she has intermittent abdominal pain for more than 2 weeks.  It is located in epigastric area, worse on the left upper quadrant, at worst time 9 out of 10 in severity, sharp, radiating to the left back, currently 4 out of 10 in severity.  Patient does not have nausea, vomiting or diarrhea.  No fever, but has chills.  Denies chest pain, cough, shortness of breath.  No symptoms of UTI. Patient states that she took Advil for many years in the past, but currently not taking NSAIDs.  No dark stool or rectal bleeding.  Denies symptoms of acid reflux.   ED Course: pt was found to have WBC 7.9, hemoglobin 14.2, INR 1.1, troponin level 3, normal liver function, lipase 32, pending COVID PCR, electrolytes renal function okay, temperature normal, blood pressure 123/66, heart rate 67, RR 16, oxygen saturation 100% on room air.  Patient is admitted to MedSurg bed as inpatient.  Dr. Servando Snare of GI and Dr. Aleen Campi and PA Manus Rudd of surgery are consulted.   CT-abd/pelvis: 1. Postoperative changes since January of appendectomy, distal partial colectomy, colostomy and colostomy reversal. No bowel obstruction or inflammation. However, the cecum is now inseparable from the right adnexa and uterine fundus, and might be adhesed. There is no regional inflammation or gas within the endometrial cavity, but there is vaginal gas. If there is unexplained vaginal discharge then consider the possibility of a  fistula.   2. No other acute or inflammatory process identified in the abdomen or pelvis. Stable benign appearing hepatic and splenic cysts and/or hemangiomas. Mild aortic atherosclerosis.  US-RUQ: 1. Evidence of gallbladder Adenomyomatosis. And there is a small focus of adherent sludge versus nonshadowing stone. But no evidence of acute cholecystitis. 2. No evidence of bile duct obstruction. Benign appearing hepatic cysts.   Review of Systems:   General: no fevers, chills, no body weight gain, fatigue HEENT: no blurry vision, hearing changes or sore throat Respiratory: no dyspnea, coughing, wheezing CV: no chest pain, no palpitations GI: no nausea, vomiting, has abdominal pain, no diarrhea, constipation GU: no dysuria, burning on urination, increased urinary frequency, hematuria  Ext: no leg edema Neuro: no unilateral weakness, numbness, or tingling, no vision change or hearing loss Skin: no rash, no skin tear. MSK: No muscle spasm, no deformity, no limitation of range of movement in spin Heme: No easy bruising.  Travel history: No recent long distant travel.  Allergy: No Known Allergies  Past Medical History:  Diagnosis Date   Allergy    Asthma    ALLERGY RELATED   Complication of anesthesia    NAUSEA   Family history of adverse reaction to anesthesia    FATHER HAS PONV    Past Surgical History:  Procedure Laterality Date   COLECTOMY WITH COLOSTOMY CREATION/HARTMANN PROCEDURE N/A 03/29/2020   Procedure: COLECTOMY WITH COLOSTOMY CREATION/HARTMANN PROCEDURE;  Surgeon: Leafy Ro, MD;  Location: [facility] ORS;  Service: General;  Laterality: N/A;   COLONOSCOPY  2012   cleared for 10 yrs- Dr Bluford Kaufmann   COLONOSCOPY WITH PROPOFOL N/A 07/12/2020   Procedure: COLONOSCOPY WITH PROPOFOL THROUGH COLOSTOMY;  Surgeon: Midge Minium, MD;  Location: [facility] ENDOSCOPY;  Service: Endoscopy;  Laterality: N/A;   DENTAL WORK     INCISIONAL HERNIA REPAIR N/A 08/09/2020   Procedure: HERNIA REPAIR  INCISIONAL;  Surgeon: Leafy Ro, MD;  Location: [facility] ORS;  Service: General;  Laterality: N/A;   LAPAROSCOPIC APPENDECTOMY  08/09/2020   Procedure: APPENDECTOMY LAPAROSCOPIC;  Surgeon: Leafy Ro, MD;  Location: [facility] ORS;  Service: General;;   XI ROBOTIC ASSISTED COLOSTOMY TAKEDOWN N/A 08/09/2020   Procedure: XI ROBOTIC ASSISTED COLOSTOMY TAKEDOWN with RNFA to assist;  Surgeon: Leafy Ro, MD;  Location: [facility] ORS;  Service: General;  Laterality: N/A;    Social History:  reports that she has quit smoking. She has never used smokeless tobacco. She reports that she does not currently use alcohol. She reports that she does not use drugs.  Family History:  Family History  Problem Relation Age of Onset   Cancer Mother    Breast cancer Mother 45   Diabetes Father    Hypertension Father      Prior to Admission medications   Medication Sig Start Date End Date Taking? Authorizing Provider  dicyclomine (BENTYL) 10 MG capsule Take 1 capsule (10 mg total) by mouth 4 (four) times daily for 14 days. 11/10/20 11/24/20 Yes Veronese, [place], MD  ondansetron (ZOFRAN ODT) 4 MG disintegrating tablet Take 1 tablet (4 mg total) by mouth every 8 (eight) hours as needed. 11/10/20  Yes Don Perking, [place], MD  pantoprazole (PROTONIX) 20 MG tablet Take 1 tablet (20 mg total) by mouth daily. 11/10/20 11/10/21 Yes Veronese, [place], MD  acetaminophen (TYLENOL) 500 MG tablet Take 500 mg by mouth every 6 (six) hours as needed for moderate pain or headache.    [provider]  Calcium Carbonate-Vit D-Min (CALCIUM 600+D PLUS MINERALS PO) Take 1 tablet by mouth daily.    [provider]  COLLAGEN-VITAMIN C PO Take 4 tablets by mouth daily.    [provider]  Cranberry-Vitamin C (AZO CRANBERRY URINARY TRACT PO) Take 2 tablets by mouth daily.    [provider]  Multiple Vitamins-Minerals (CENTRUM SILVER ULTRA WOMENS PO) Take 1 tablet by mouth daily.    [provider]     Physical Exam: Vitals:   11/10/20 1200 11/10/20 1205 11/10/20 1300 11/10/20 1400  BP: (!) 127/111 124/71 133/63 132/60  Pulse: 71 72 77 78  Resp:   18   Temp:      TempSrc:      SpO2: 98% 100% 99% 98%  Weight:      Height:       General: Not in acute distress HEENT:       Eyes: PERRL, EOMI, no scleral icterus.       ENT: No discharge from the ears and nose, no pharynx injection, no tonsillar enlargement.        Neck: No JVD, no bruit, no mass felt. Heme: No neck lymph node enlargement. Cardiac: S1/S2, RRR, No murmurs, No gallops or rubs. Respiratory: No rales, wheezing, rhonchi or rubs. GI: Soft, nondistended, has tenderness in LUQ, no rebound pain, no organomegaly, BS present. GU: No hematuria Ext: No pitting leg edema bilaterally. 1+DP/PT pulse bilaterally. Musculoskeletal: No joint deformities, No joint redness or warmth, no limitation of ROM in spin. Skin: No rashes.  Neuro: Alert, oriented X3, cranial nerves II-XII grossly  intact, moves all extremities normally.  Psych: Patient is not psychotic, no suicidal or hemocidal ideation.  Labs on Admission: I have personally reviewed following labs and imaging studies  CBC: Recent Labs  Lab 11/07/20 1107 11/09/20 2306  WBC 7.9 7.9  NEUTROABS 5.1  --   HGB 14.6 14.2  HCT 43.5 41.4  MCV 89.5 88.1  PLT 232 230   Basic Metabolic Panel: Recent Labs  Lab 11/07/20 1107 11/09/20 2306  NA 141 138  K 4.0 4.0  CL 109 108  CO2 26 21*  GLUCOSE 117* 114*  BUN 19 11  CREATININE 0.81 0.60  CALCIUM 9.5 9.5   GFR: Estimated Creatinine Clearance: 63.6 mL/min (by C-G formula based on SCr of 0.6 mg/dL). Liver Function Tests: Recent Labs  Lab 11/07/20 1107 11/09/20 2306  AST 27 26  ALT 23 18  ALKPHOS 58 58  BILITOT 0.9 0.7  PROT 7.6 7.4  ALBUMIN 4.0 3.6   Recent Labs  Lab 11/07/20 1107 11/09/20 2306  LIPASE 31 32   No results for input(s): AMMONIA in the last 168 hours. Coagulation Profile: Recent Labs  Lab  11/07/20 1107  INR 1.1   Cardiac Enzymes: No results for input(s): CKTOTAL, CKMB, CKMBINDEX, TROPONINI in the last 168 hours. BNP (last 3 results) No results for input(s): PROBNP in the last 8760 hours. HbA1C: No results for input(s): HGBA1C in the last 72 hours. CBG: No results for input(s): GLUCAP in the last 168 hours. Lipid Profile: No results for input(s): CHOL, HDL, LDLCALC, TRIG, CHOLHDL, LDLDIRECT in the last 72 hours. Thyroid Function Tests: No results for input(s): TSH, T4TOTAL, FREET4, T3FREE, THYROIDAB in the last 72 hours. Anemia Panel: No results for input(s): VITAMINB12, FOLATE, FERRITIN, TIBC, IRON, RETICCTPCT in the last 72 hours. Urine analysis:    Component Value Date/Time   COLORURINE YELLOW (A) 03/29/2020 1335   APPEARANCEUR CLEAR (A) 03/29/2020 1335   LABSPEC >1.046 (H) 03/29/2020 1335   PHURINE 5.0 03/29/2020 1335   GLUCOSEU NEGATIVE 03/29/2020 1335   HGBUR SMALL (A) 03/29/2020 1335   BILIRUBINUR NEGATIVE 03/29/2020 1335   BILIRUBINUR negative 04/07/2015 0958   KETONESUR 20 (A) 03/29/2020 1335   PROTEINUR NEGATIVE 03/29/2020 1335   UROBILINOGEN 0.2 04/07/2015 0958   NITRITE NEGATIVE 03/29/2020 1335   LEUKOCYTESUR NEGATIVE 03/29/2020 1335   Sepsis Labs: (procalcitonin:4,lacticidven:4) )No results found for this or any previous visit (from the past 240 hour(s)).   Radiological Exams on Admission: CT ABDOMEN PELVIS W CONTRAST  Result Date: 11/10/2020 CLINICAL DATA:  65 year old female with 2 weeks of epigastric abdominal pain. History of diverticulitis with perforation, colostomy and colostomy reversal this year. EXAM: CT ABDOMEN AND PELVIS WITH CONTRAST TECHNIQUE: Multidetector CT imaging of the abdomen and pelvis was performed using the standard protocol following bolus administration of intravenous contrast. CONTRAST:  OMNIPAQUE IOHEXOL 350 MG/ML SOLN COMPARISON:  CT Abdomen and Pelvis 03/29/2020. FINDINGS: Lower chest: Mild bilateral  lower lobe scarring is stable. Otherwise negative. Hepatobiliary: Stable liver with 2 or 3 small circumscribed low-density areas, the largest in the right lobe on series 2, image 15 has simple fluid density compatible with a benign cyst. Mild hepatic steatosis. Negative gallbladder. Pancreas: Negative. Spleen: Circumscribed round low-density splenic lesion at the anterior pole is stable measuring 2 cm and has simple fluid density. There is a similar size stable small area of round hyperenhancement in the mid spleen on image 19. Both of these appear benign. Adrenals/Urinary Tract: Normal adrenal glands. Stable and negative kidneys. Symmetric renal  enhancement and contrast excretion with decompressed ureters. Diminutive and unremarkable bladder. Stomach/Bowel: Negative rectum. Sigmoid colon anastomosis on series 2, image 59 with no adverse features. Decompressed upstream large bowel with occasional diverticula. Negative transverse colon. Negative right colon with evidence of interval appendectomy. No dilated small bowel. Decompressed stomach and duodenum. No free air or free fluid. Postoperative changes to the ventral abdominal wall both in the midline and in both lower quadrants. No postoperative fluid collection. Vascular/Lymphatic: Mild Aortoiliac calcified atherosclerosis. Major arterial structures are patent. No lymphadenopathy. Portal venous system is patent. Reproductive: The cecum is inseparable from the right adnexa and uterine fundus now, in the area of appendectomy. There is no gas identified within the endometrium. But there is prominent gas within the vagina and vaginal fornix. Still, no regional inflammation. Other: No pelvic free fluid. Musculoskeletal: No acute osseous abnormality identified. IMPRESSION: 1. Postoperative changes since January of appendectomy, distal partial colectomy, colostomy and colostomy reversal. No bowel obstruction or inflammation. However, the cecum is now inseparable from the  right adnexa and uterine fundus, and might be adhesed. There is no regional inflammation or gas within the endometrial cavity, but there is vaginal gas. If there is unexplained vaginal discharge then consider the possibility of a fistula. 2. No other acute or inflammatory process identified in the abdomen or pelvis. Stable benign appearing hepatic and splenic cysts and/or hemangiomas. Mild aortic atherosclerosis. Electronically Signed   By: Odessa Fleming M.D.   On: 11/10/2020 04:52   US ABDOMEN LIMITED RUQ (LIVER/GB)  Result Date: 11/10/2020 CLINICAL DATA:  65 year old female with 2 weeks of epigastric pain. EXAM: ULTRASOUND ABDOMEN LIMITED RIGHT UPPER QUADRANT COMPARISON:  CT Abdomen and Pelvis 0427 hours today. FINDINGS: Gallbladder: Ring down artifact from the mildly thickened anterior gallbladder wall (image 19). But in general the gallbladder wall thickness is normal. Small 4 mm nonshadowing stone, versus adherent sludge or small gallbladder polyp on image 18. No pericholecystic fluid. No sonographic Murphy sign elicited. Common bile duct: Diameter: 4 mm, normal. Liver: Small hepatic cysts redemonstrated, the largest on image 42 is 2 cm and appears benign with increased through transmission. No suspicious liver lesion. But the liver is mildly echogenic (image 38). Portal vein is patent on color Doppler imaging with normal direction of blood flow towards the liver. Other: Negative visible right kidney. IMPRESSION: 1. Evidence of gallbladder Adenomyomatosis. And there is a small focus of adherent sludge versus nonshadowing stone. But no evidence of acute cholecystitis. 2. No evidence of bile duct obstruction. Benign appearing hepatic cysts. Electronically Signed   By: Odessa Fleming M.D.   On: 11/10/2020 07:39     EKG: I have personally reviewed.  Sinus rhythm, QTC 394, early R wave progression  Assessment/Plan Principal Problem:   Abdominal pain Active Problems:   Asthma   Abdominal pain: Etiology is not  clear.  General surgery is consulted, they think "Unlikely any surgical complication in regards to her recent surgeries".  GI is consulted, Dr. Servando Snare did EGD, which showed duodenal erosion without bleeding and Z-line irregular at the gastroesophageal junction.   -admit to med-surg bed as inp -protonix 40 mg bid -prn morphine and zofran. -IVF: 100 cc/h of NS --> stopped after procedurfe  Asthma: stable -prn Albuterol     DVT ppx: SCD Code Status: Full code Family Communication:  Yes, patient's  husband at bed side Disposition Plan:  Anticipate discharge back to previous environment Consults called: Dr. Servando Snare of GI. Dr. Aleen Campi and PA Manus Rudd of surgery Admission status  and Level of care: Med-Surg:    Med-surg bed as inpt    Status is: Inpatient  Remains inpatient appropriate because:Inpatient level of care appropriate due to severity of illness  Dispo: The patient is from: Home              Anticipated d/c is to: Home              Patient currently is not medically stable to d/c.   Difficult to place patient No          Date of Service 11/10/2020    Lorretta HarpXilin Eris Breck [facility]   If 7PM-7AM, please contact night-coverage [email or web address] 11/10/2020, 2:32 PM

## 2020-11-10 NOTE — Anesthesia Preprocedure Evaluation (Addendum)
Anesthesia Evaluation  Patient identified by MRN, date of birth, ID band Patient awake    Reviewed: Allergy & Precautions, NPO status , Patient's Chart, lab work & pertinent test results  History of Anesthesia Complications Negative for: history of anesthetic complications  Airway Mallampati: III  TM Distance: >3 FB Neck ROM: Full    Dental  (+) Implants   Pulmonary asthma (allergy induced) , neg sleep apnea, neg COPD, former smoker,    breath sounds clear to auscultation- rhonchi (-) wheezing      Cardiovascular Exercise Tolerance: Good (-) hypertension(-) CAD, (-) Past MI, (-) Cardiac Stents and (-) CABG  Rhythm:Regular Rate:Normal - Systolic murmurs and - Diastolic murmurs    Neuro/Psych neg Seizures negative neurological ROS  negative psych ROS   GI/Hepatic Neg liver ROS, Epigastric pain of unknown origin s/p hartmann procedure   Endo/Other  negative endocrine ROSneg diabetes  Renal/GU negative Renal ROS     Musculoskeletal negative musculoskeletal ROS (+)   Abdominal (+) + obese,   Peds  Hematology negative hematology ROS (+)   Anesthesia Other Findings Past Medical History: No date: Allergy No date: Asthma     Comment:  ALLERGY RELATED No date: Complication of anesthesia     Comment:  NAUSEA No date: Family history of adverse reaction to anesthesia     Comment:  FATHER HAS PONV   Reproductive/Obstetrics                            Anesthesia Physical  Anesthesia Plan  ASA: II  Anesthesia Plan: General   Post-op Pain Management:    Induction: Intravenous  PONV Risk Score and Plan: 2 and TIVA  Airway Management Planned: Nasal Cannula  Additional Equipment:   Intra-op Plan:   Post-operative Plan: Extubation in OR  Informed Consent: I have reviewed the patients History and Physical, chart, labs and discussed the procedure including the risks, benefits and  alternatives for the proposed anesthesia with the patient or authorized representative who has indicated his/her understanding and acceptance.     Dental advisory given  Plan Discussed with: CRNA and Anesthesiologist  Anesthesia Plan Comments:        Anesthesia Quick Evaluation

## 2020-11-10 NOTE — Discharge Instructions (Addendum)

## 2020-11-10 NOTE — Op Note (Signed)
[facility] Gastroenterology Patient Name: Monica Mckenzie Procedure Date: 11/10/2020 3:53 PM MRN: [id number] Account #: [id number] Date of Birth: 10-27-1955 Admit Type: Inpatient Age: 65 Room: [facility] ENDO ROOM 3 Gender: Female Note Status: Finalized Procedure:             Upper GI endoscopy Indications:           Abdominal pain in the left upper quadrant Providers:             Midge Minium MD, MD Referring MD:          Hassell Halim MD (Referring MD) Medicines:             Propofol per Anesthesia Complications:         No immediate complications. Procedure:             Pre-Anesthesia Assessment:                        - Prior to the procedure, a History and Physical was                         performed, and patient medications and allergies were                         reviewed. The patient's tolerance of previous                         anesthesia was also reviewed. The risks and benefits                         of the procedure and the sedation options and risks                         were discussed with the patient. All questions were                         answered, and informed consent was obtained. Prior                         Anticoagulants: The patient has taken no previous                         anticoagulant or antiplatelet agents. ASA Grade                         Assessment: II - A patient with mild systemic disease.                         After reviewing the risks and benefits, the patient                         was deemed in satisfactory condition to undergo the                         procedure.                        After obtaining informed consent, the endoscope was  passed under direct vision. Throughout the procedure,                         the patient's blood pressure, pulse, and oxygen                         saturations were monitored continuously. The Endoscope                         was introduced through  the mouth, and advanced to the                         second part of duodenum. The upper GI endoscopy was                         accomplished without difficulty. The patient tolerated                         the procedure well. Findings:      The Z-line was irregular and was found at the gastroesophageal junction.      The entire examined stomach was normal.      A single erosion without bleeding was found in the duodenal bulb. Impression:            - Z-line irregular, at the gastroesophageal junction.                        - Normal stomach.                        - Duodenal erosion without bleeding.                        - No specimens collected. Recommendation:        - Return patient to hospital ward for ongoing care.                        - Resume previous diet.                        - Continue present medications. Procedure Code(s):     --- Professional ---                        [phone], Esophagogastroduodenoscopy, flexible,                         transoral; diagnostic, including collection of                         specimen(s) by brushing or washing, when performed                         (separate procedure) Diagnosis Code(s):     --- Professional ---                        R10.12, Left upper quadrant pain                        K26.9, Duodenal ulcer, unspecified as acute or  chronic, without hemorrhage or perforation CPT copyright 2019 [employer]. All rights reserved. The codes documented in this report are preliminary and upon coder review may  be revised to meet current compliance requirements. Midge Minium MD, MD 11/10/2020 4:19:53 PM This report has been signed electronically. Number of Addenda: 0 Note Initiated On: 11/10/2020 3:53 PM Estimated Blood Loss:  Estimated blood loss: none.      [facility]

## 2020-11-10 NOTE — Transfer of Care (Signed)
Immediate Anesthesia Transfer of Care Note  Patient: Monica Mckenzie  Procedure(s) Performed: ESOPHAGOGASTRODUODENOSCOPY (EGD) WITH PROPOFOL  Patient Location: Endoscopy Unit  Anesthesia Type:General  Level of Consciousness: awake and alert   Airway & Oxygen Therapy: Patient Spontanous Breathing  Post-op Assessment: Report given to RN and Post -op Vital signs reviewed and stable  Post vital signs: Reviewed  Last Vitals:  Vitals Value Taken Time  BP    Temp    Pulse 99 11/10/20 1623  Resp 17 11/10/20 1623  SpO2 97 % 11/10/20 1623    Last Pain:  Vitals:   11/10/20 1602  TempSrc: Temporal  PainSc: 0-No pain         Complications: No notable events documented.

## 2020-11-10 NOTE — Consult Note (Signed)
Monica Miniumarren Laresha Bacorn, MD [facility]  [address] [place], [place] [address] Phone: [phone] Fax : [phone]  Consultation  Referring Provider:     Dr. Clyde LundborgNiu Primary Care Physician:  Kandyce RudBabaoff, Marcus, MD Primary Gastroenterologist:  Dr. Servando SnareWohl         Reason for Consultation:     Left upper quadrant pain  Date of Admission:  11/10/2020 Date of Consultation:  11/10/2020         HPI:   Monica PagesCarol M Mckenzie is a 65 y.o. female who comes in after having surgery for diverticulitis with colostomy takedown 3 months ago.  The patient had seen her surgeon 3 days ago and reports that she has been having some epigastric pain that was intermittent and associated with meals.  The patient's diverticular disease was complicated by a perforation and a partial colectomy.  She underwent a colonoscopy by me prior to having her reversal surgery.  The patient reports that nothing makes the pain any better but eating makes it worse.  For the 24 hours prior to being admitted the patient reported that the symptoms were persistent and at times severe.  Patient had a CT scan of the abdomen that showed:  IMPRESSION: 1. Postoperative changes since January of appendectomy, distal partial colectomy, colostomy and colostomy reversal. No bowel obstruction or inflammation. However, the cecum is now inseparable from the right adnexa and uterine fundus, and might be adhesed. There is no regional inflammation or gas within the endometrial cavity, but there is vaginal gas. If there is unexplained vaginal discharge then consider the possibility of a fistula.  2. No other acute or inflammatory process identified in the abdomen or pelvis. Stable benign appearing hepatic and splenic cysts and/or hemangiomas. Mild aortic atherosclerosis.  I am now being asked to see the patient for the abdominal pain since surgery does not feel that the pain is from the patient's operation.  The patient had a normal CBC and normal liver  enzymes.  Past Medical History:  Diagnosis Date  . Allergy   . Asthma    ALLERGY RELATED  . Complication of anesthesia    NAUSEA  . Family history of adverse reaction to anesthesia    FATHER HAS PONV    Past Surgical History:  Procedure Laterality Date  . COLECTOMY WITH COLOSTOMY CREATION/HARTMANN PROCEDURE N/A 03/29/2020   Procedure: COLECTOMY WITH COLOSTOMY CREATION/HARTMANN PROCEDURE;  Surgeon: Leafy RoPabon, Diego F, MD;  Location: [facility];  Service: General;  Laterality: N/A;  . COLONOSCOPY  2012   cleared for 10 yrs- Dr Bluford Kaufmannh  . COLONOSCOPY WITH PROPOFOL N/A 07/12/2020   Procedure: COLONOSCOPY WITH PROPOFOL THROUGH COLOSTOMY;  Surgeon: Monica MiniumWohl, Breslin Hemann, MD;  Location: [facility];  Service: Endoscopy;  Laterality: N/A;  . DENTAL WORK    . INCISIONAL HERNIA REPAIR N/A 08/09/2020   Procedure: HERNIA REPAIR INCISIONAL;  Surgeon: Leafy RoPabon, Diego F, MD;  Location: [facility];  Service: General;  Laterality: N/A;  . LAPAROSCOPIC APPENDECTOMY  08/09/2020   Procedure: APPENDECTOMY LAPAROSCOPIC;  Surgeon: Leafy RoPabon, Diego F, MD;  Location: [facility];  Service: General;;  . XI ROBOTIC ASSISTED COLOSTOMY TAKEDOWN N/A 08/09/2020   Procedure: XI ROBOTIC ASSISTED COLOSTOMY TAKEDOWN with RNFA to assist;  Surgeon: Leafy RoPabon, Diego F, MD;  Location: [facility];  Service: General;  Laterality: N/A;    Prior to Admission medications   Medication Sig Start Date End Date Taking? Authorizing Provider  acetaminophen (TYLENOL) 500 MG tablet Take 500 mg by mouth every 6 (six) hours as needed  for moderate pain or headache.   Yes [provider]  Calcium Carbonate-Vit D-Min (CALCIUM 600+D PLUS MINERALS PO) Take 1 tablet by mouth daily.   Yes [provider]  COLLAGEN-VITAMIN C PO Take 4 tablets by mouth daily.   Yes [provider]  Cranberry-Vitamin C (AZO CRANBERRY URINARY TRACT PO) Take 2 tablets by mouth daily.   Yes [provider]  dicyclomine (BENTYL) 10 MG capsule Take 1 capsule (10 mg  total) by mouth 4 (four) times daily for 14 days. 11/10/20 11/24/20 Yes Veronese, [place], MD  Multiple Vitamins-Minerals (CENTRUM SILVER ULTRA WOMENS PO) Take 1 tablet by mouth daily.   Yes [provider]  ondansetron (ZOFRAN ODT) 4 MG disintegrating tablet Take 1 tablet (4 mg total) by mouth every 8 (eight) hours as needed. 11/10/20  Yes Don Perking, [place], MD  pantoprazole (PROTONIX) 20 MG tablet Take 1 tablet (20 mg total) by mouth daily. 11/10/20 11/10/21 Yes Nita Sickle, MD    Family History  Problem Relation Age of Onset  . Cancer Mother   . Breast cancer Mother 62  . Diabetes Father   . Hypertension Father      Social History   Tobacco Use  . Smoking status: Former  . Smokeless tobacco: Never  Vaping Use  . Vaping Use: Never used  Substance Use Topics  . Alcohol use: Not Currently    Comment: EVERY 3 MONTHS  . Drug use: No    Allergies as of 11/09/2020  . (No Known Allergies)    Review of Systems:    All systems reviewed and negative except where noted in HPI.   Physical Exam:  Vital signs in last 24 hours: Temp:  [97.8 F (36.6 C)] 97.8 F (36.6 C) (08/24 2259) Pulse Rate:  [66-90] 78 (08/25 1400) Resp:  [15-20] 18 (08/25 1300) BP: (112-136)/(60-111) 132/60 (08/25 1400) SpO2:  [94 %-100 %] 98 % (08/25 1400) Weight:  [73.9 kg] 73.9 kg (08/24 2257)   General:   Pleasant, cooperative in NAD Head:  Normocephalic and atraumatic. Eyes:   No icterus.   Conjunctiva pink. PERRLA. Ears:  Normal auditory acuity. Neck:  Supple; no masses or thyroidomegaly Lungs: Respirations even and unlabored. Lungs clear to auscultation bilaterally.   No wheezes, crackles, or rhonchi.  Heart:  Regular rate and rhythm;  Without murmur, clicks, rubs or gallops Abdomen:  Soft, nondistended, active tenderness to palpation on the mid abdomen and left upper quadrant. Normal bowel sounds. No appreciable masses or hepatomegaly.  No rebound or guarding.  Rectal:  Not  performed. Msk:  Symmetrical without gross deformities.    Extremities:  Without edema, cyanosis or clubbing. Neurologic:  Alert and oriented x3;  grossly normal neurologically. Skin:  Intact without significant lesions or rashes. Cervical Nodes:  No significant cervical adenopathy. Psych:  Alert and cooperative. Normal affect.  LAB RESULTS: Recent Labs    11/09/20 2306  WBC 7.9  HGB 14.2  HCT 41.4  PLT 230   BMET Recent Labs    11/09/20 2306  NA 138  K 4.0  CL 108  CO2 21*  GLUCOSE 114*  BUN 11  CREATININE 0.60  CALCIUM 9.5   LFT Recent Labs    11/09/20 2306  PROT 7.4  ALBUMIN 3.6  AST 26  ALT 18  ALKPHOS 58  BILITOT 0.7   PT/INR No results for input(s): LABPROT, INR in the last 72 hours.  STUDIES: CT ABDOMEN PELVIS W CONTRAST  Result Date: 11/10/2020 CLINICAL DATA:  65 year old  female with 2 weeks of epigastric abdominal pain. History of diverticulitis with perforation, colostomy and colostomy reversal this year. EXAM: CT ABDOMEN AND PELVIS WITH CONTRAST TECHNIQUE: Multidetector CT imaging of the abdomen and pelvis was performed using the standard protocol following bolus administration of intravenous contrast. CONTRAST:  OMNIPAQUE IOHEXOL 350 MG/ML SOLN COMPARISON:  CT Abdomen and Pelvis 03/29/2020. FINDINGS: Lower chest: Mild bilateral lower lobe scarring is stable. Otherwise negative. Hepatobiliary: Stable liver with 2 or 3 small circumscribed low-density areas, the largest in the right lobe on series 2, image 15 has simple fluid density compatible with a benign cyst. Mild hepatic steatosis. Negative gallbladder. Pancreas: Negative. Spleen: Circumscribed round low-density splenic lesion at the anterior pole is stable measuring 2 cm and has simple fluid density. There is a similar size stable small area of round hyperenhancement in the mid spleen on image 19. Both of these appear benign. Adrenals/Urinary Tract: Normal adrenal glands. Stable and negative  kidneys. Symmetric renal enhancement and contrast excretion with decompressed ureters. Diminutive and unremarkable bladder. Stomach/Bowel: Negative rectum. Sigmoid colon anastomosis on series 2, image 59 with no adverse features. Decompressed upstream large bowel with occasional diverticula. Negative transverse colon. Negative right colon with evidence of interval appendectomy. No dilated small bowel. Decompressed stomach and duodenum. No free air or free fluid. Postoperative changes to the ventral abdominal wall both in the midline and in both lower quadrants. No postoperative fluid collection. Vascular/Lymphatic: Mild Aortoiliac calcified atherosclerosis. Major arterial structures are patent. No lymphadenopathy. Portal venous system is patent. Reproductive: The cecum is inseparable from the right adnexa and uterine fundus now, in the area of appendectomy. There is no gas identified within the endometrium. But there is prominent gas within the vagina and vaginal fornix. Still, no regional inflammation. Other: No pelvic free fluid. Musculoskeletal: No acute osseous abnormality identified. IMPRESSION: 1. Postoperative changes since January of appendectomy, distal partial colectomy, colostomy and colostomy reversal. No bowel obstruction or inflammation. However, the cecum is now inseparable from the right adnexa and uterine fundus, and might be adhesed. There is no regional inflammation or gas within the endometrial cavity, but there is vaginal gas. If there is unexplained vaginal discharge then consider the possibility of a fistula. 2. No other acute or inflammatory process identified in the abdomen or pelvis. Stable benign appearing hepatic and splenic cysts and/or hemangiomas. Mild aortic atherosclerosis. Electronically Signed   By: Odessa Fleming M.D.   On: 11/10/2020 04:52   US ABDOMEN LIMITED RUQ (LIVER/GB)  Result Date: 11/10/2020 CLINICAL DATA:  65 year old female with 2 weeks of epigastric pain. EXAM: ULTRASOUND  ABDOMEN LIMITED RIGHT UPPER QUADRANT COMPARISON:  CT Abdomen and Pelvis 0427 hours today. FINDINGS: Gallbladder: Ring down artifact from the mildly thickened anterior gallbladder wall (image 19). But in general the gallbladder wall thickness is normal. Small 4 mm nonshadowing stone, versus adherent sludge or small gallbladder polyp on image 18. No pericholecystic fluid. No sonographic Murphy sign elicited. Common bile duct: Diameter: 4 mm, normal. Liver: Small hepatic cysts redemonstrated, the largest on image 42 is 2 cm and appears benign with increased through transmission. No suspicious liver lesion. But the liver is mildly echogenic (image 38). Portal vein is patent on color Doppler imaging with normal direction of blood flow towards the liver. Other: Negative visible right kidney. IMPRESSION: 1. Evidence of gallbladder Adenomyomatosis. And there is a small focus of adherent sludge versus nonshadowing stone. But no evidence of acute cholecystitis. 2. No evidence of bile duct obstruction. Benign appearing hepatic cysts.  Electronically Signed   By: Odessa Fleming M.D.   On: 11/10/2020 07:39      Impression / Plan:   Assessment: Principal Problem:   Abdominal pain Active Problems:   Asthma   TENISHIA EKMAN is a 65 y.o. y/o female with abdominal pain in the epigastric area and left upper quadrant.  The patient states that it is worse with eating.  The patient was recommended to be seen by GI for possible peptic ulcer disease and it was thought that the patient's symptoms were not related to her surgery.  Plan:  The patient has been told that we can take her for an upper endoscopy today.  The patient has agreed to undergo the procedure.  The case was slightly delayed due to waiting for the patient's COVID test which returned negative.  The patient will be brought to the endoscopy unit for her upper endoscopy.  The patient has been explained the plan agrees with it.  Thank you for involving me in the care  of this patient.      LOS: 0 days   Monica Minium, MD, [facility] 11/10/2020, 3:38 PM,  Pager [phone] 7am-5pm  Check AMION for 5pm -7am coverage and on weekends   Note: This dictation was prepared with Dragon dictation along with smaller phrase technology. Any transcriptional errors that result from this process are unintentional.

## 2020-11-10 NOTE — ED Notes (Signed)
Pt to CT vis stretcher.

## 2020-11-10 NOTE — ED Provider Notes (Signed)
[facility] Emergency Department Provider Note  ____________________________________________  Time seen: Approximately 4:33 AM  I have reviewed the triage vital signs and the nursing notes.   HISTORY  Chief Complaint Abdominal Pain   HPI Monica Mckenzie is a 65 y.o. female with history of diverticular disease complicated by perforation followed by partial colectomy, asthma who presents for evaluation of abdominal pain.  Patient reports that she has been having this pain for about 2 weeks.  The pain is sharp located in the epigastric region and markedly worse after she eats.  She reports that the pain may last from a couple of hours to an entire day but usually resolves without intervention.  Over the last 24 hours the pain has been persistent.  At times severe, right now moderate.  She has had some nausea associated with it but no vomiting, no diarrhea, no constipation, no chest pain, no shortness of breath, no dysuria or hematuria.  She has been seen by her doctor for this pain and has an outpatient CT and ultrasound scheduled however she reports that she can no longer live with this pain.  She reports that she has been unable to eat because the pain becomes so severe after she eats   Past Medical History:  Diagnosis Date   Allergy    Asthma    ALLERGY RELATED   Complication of anesthesia    NAUSEA   Family history of adverse reaction to anesthesia    FATHER HAS PONV    Patient Active Problem List   Diagnosis Date Noted   S/P colostomy takedown 08/09/2020   Diverticulitis of colon    Polyp of transverse colon    Diverticulitis of large intestine with perforation without bleeding 03/29/2020   Encounter to establish care with new doctor 03/03/2015    Past Surgical History:  Procedure Laterality Date   COLECTOMY WITH COLOSTOMY CREATION/HARTMANN PROCEDURE N/A 03/29/2020   Procedure: COLECTOMY WITH COLOSTOMY CREATION/HARTMANN PROCEDURE;  Surgeon: Leafy Ro, MD;  Location: [facility] ORS;  Service: General;  Laterality: N/A;   COLONOSCOPY  2012   cleared for 10 yrs- Dr Bluford Kaufmann   COLONOSCOPY WITH PROPOFOL N/A 07/12/2020   Procedure: COLONOSCOPY WITH PROPOFOL THROUGH COLOSTOMY;  Surgeon: Midge Minium, MD;  Location: [facility] ENDOSCOPY;  Service: Endoscopy;  Laterality: N/A;   DENTAL WORK     INCISIONAL HERNIA REPAIR N/A 08/09/2020   Procedure: HERNIA REPAIR INCISIONAL;  Surgeon: Leafy Ro, MD;  Location: [facility] ORS;  Service: General;  Laterality: N/A;   LAPAROSCOPIC APPENDECTOMY  08/09/2020   Procedure: APPENDECTOMY LAPAROSCOPIC;  Surgeon: Leafy Ro, MD;  Location: [facility] ORS;  Service: General;;   XI ROBOTIC ASSISTED COLOSTOMY TAKEDOWN N/A 08/09/2020   Procedure: XI ROBOTIC ASSISTED COLOSTOMY TAKEDOWN with RNFA to assist;  Surgeon: Leafy Ro, MD;  Location: [facility] ORS;  Service: General;  Laterality: N/A;    Prior to Admission medications   Medication Sig Start Date End Date Taking? Authorizing Provider  dicyclomine (BENTYL) 10 MG capsule Take 1 capsule (10 mg total) by mouth 4 (four) times daily for 14 days. 11/10/20 11/24/20 Yes Raymir Frommelt, [place], MD  ondansetron (ZOFRAN ODT) 4 MG disintegrating tablet Take 1 tablet (4 mg total) by mouth every 8 (eight) hours as needed. 11/10/20  Yes Don Perking, [place], MD  pantoprazole (PROTONIX) 20 MG tablet Take 1 tablet (20 mg total) by mouth daily. 11/10/20 11/10/21 Yes Yoel Kaufhold, [place], MD  acetaminophen (TYLENOL) 500 MG tablet Take 500 mg by mouth every  6 (six) hours as needed for moderate pain or headache.    [provider]  Calcium Carbonate-Vit D-Min (CALCIUM 600+D PLUS MINERALS PO) Take 1 tablet by mouth daily.    [provider]  COLLAGEN-VITAMIN C PO Take 4 tablets by mouth daily.    [provider]  Cranberry-Vitamin C (AZO CRANBERRY URINARY TRACT PO) Take 2 tablets by mouth daily.    [provider]  Multiple Vitamins-Minerals (CENTRUM SILVER ULTRA WOMENS PO) Take  1 tablet by mouth daily.    [provider]    Allergies Patient has no known allergies.  Family History  Problem Relation Age of Onset   Cancer Mother    Breast cancer Mother 79   Diabetes Father    Hypertension Father     Social History Social History   Tobacco Use   Smoking status: Former   Smokeless tobacco: Never  Building services engineer Use: Never used  Substance Use Topics   Alcohol use: Not Currently    Comment: EVERY 3 MONTHS   Drug use: No    Review of Systems  Constitutional: Negative for fever. Eyes: Negative for visual changes. ENT: Negative for sore throat. Neck: No neck pain  Cardiovascular: Negative for chest pain. Respiratory: Negative for shortness of breath. Gastrointestinal: + abdominal pain. No vomiting or diarrhea. Genitourinary: Negative for dysuria. Musculoskeletal: Negative for back pain. Skin: Negative for rash. Neurological: Negative for headaches, weakness or numbness. Psych: No SI or HI  ____________________________________________   PHYSICAL EXAM:  VITAL SIGNS: ED Triage Vitals  Enc Vitals Group     BP 11/09/20 2259 132/81     Pulse Rate 11/09/20 2259 90     Resp 11/09/20 2259 20     Temp 11/09/20 2259 97.8 F (36.6 C)     Temp Source 11/09/20 2259 Oral     SpO2 11/09/20 2259 97 %     Weight 11/09/20 2257 163 lb (73.9 kg)     Height 11/09/20 2257 5\' 1"  (1.549 m)     Head Circumference --      Peak Flow --      Pain Score 11/09/20 2311 5     Pain Loc --      Pain Edu? --      Excl. in GC? --     Constitutional: Alert and oriented. Well appearing and in no apparent distress. HEENT:      Head: Normocephalic and atraumatic.         Eyes: Conjunctivae are normal. Sclera is non-icteric.       Mouth/Throat: Mucous membranes are moist.       Neck: Supple with no signs of meningismus. Cardiovascular: Regular rate and rhythm. No murmurs, gallops, or rubs. 2+ symmetrical distal pulses are present in all extremities. No  JVD. Respiratory: Normal respiratory effort. Lungs are clear to auscultation bilaterally.  Gastrointestinal: Soft, tender to palpation epigastric region and LUQ, and non distended with positive bowel sounds. No rebound or guarding. Genitourinary: No CVA tenderness. Musculoskeletal:  No edema, cyanosis, or erythema of extremities. Neurologic: Normal speech and language. Face is symmetric. Moving all extremities. No gross focal neurologic deficits are appreciated. Skin: Skin is warm, dry and intact. No rash noted. Psychiatric: Mood and affect are normal. Speech and behavior are normal.  ____________________________________________   LABS (all labs ordered are listed, but only abnormal results are displayed)  Labs Reviewed  COMPREHENSIVE METABOLIC PANEL - Abnormal; Notable for the following components:  Result Value   CO2 21 (*)    Glucose, Bld 114 (*)    All other components within normal limits  LIPASE, BLOOD  CBC  URINALYSIS, COMPLETE (UACMP) WITH MICROSCOPIC  TROPONIN I (HIGH SENSITIVITY)   ____________________________________________  EKG  ED ECG REPORT I, Nita Sicklearolina Janelie Goltz, the attending physician, personally viewed and interpreted this ECG.  Sinus rhythm with rate of 79, normal intervals, normal axis, no ST elevations or depressions.  Normal EKG peer ____________________________________________  RADIOLOGY  I have personally reviewed the images performed during this visit and I agree with the Radiologist's read.   Interpretation by Radiologist:  CT ABDOMEN PELVIS W CONTRAST  Result Date: 11/10/2020 CLINICAL DATA:  65 year old female with 2 weeks of epigastric abdominal pain. History of diverticulitis with perforation, colostomy and colostomy reversal this year. EXAM: CT ABDOMEN AND PELVIS WITH CONTRAST TECHNIQUE: Multidetector CT imaging of the abdomen and pelvis was performed using the standard protocol following bolus administration of intravenous contrast.  CONTRAST:  100mL OMNIPAQUE IOHEXOL 350 MG/ML SOLN COMPARISON:  CT Abdomen and Pelvis 03/29/2020. FINDINGS: Lower chest: Mild bilateral lower lobe scarring is stable. Otherwise negative. Hepatobiliary: Stable liver with 2 or 3 small circumscribed low-density areas, the largest in the right lobe on series 2, image 15 has simple fluid density compatible with a benign cyst. Mild hepatic steatosis. Negative gallbladder. Pancreas: Negative. Spleen: Circumscribed round low-density splenic lesion at the anterior pole is stable measuring 2 cm and has simple fluid density. There is a similar size stable small area of round hyperenhancement in the mid spleen on image 19. Both of these appear benign. Adrenals/Urinary Tract: Normal adrenal glands. Stable and negative kidneys. Symmetric renal enhancement and contrast excretion with decompressed ureters. Diminutive and unremarkable bladder. Stomach/Bowel: Negative rectum. Sigmoid colon anastomosis on series 2, image 59 with no adverse features. Decompressed upstream large bowel with occasional diverticula. Negative transverse colon. Negative right colon with evidence of interval appendectomy. No dilated small bowel. Decompressed stomach and duodenum. No free air or free fluid. Postoperative changes to the ventral abdominal wall both in the midline and in both lower quadrants. No postoperative fluid collection. Vascular/Lymphatic: Mild Aortoiliac calcified atherosclerosis. Major arterial structures are patent. No lymphadenopathy. Portal venous system is patent. Reproductive: The cecum is inseparable from the right adnexa and uterine fundus now, in the area of appendectomy. There is no gas identified within the endometrium. But there is prominent gas within the vagina and vaginal fornix. Still, no regional inflammation. Other: No pelvic free fluid. Musculoskeletal: No acute osseous abnormality identified. IMPRESSION: 1. Postoperative changes since January of appendectomy, distal  partial colectomy, colostomy and colostomy reversal. No bowel obstruction or inflammation. However, the cecum is now inseparable from the right adnexa and uterine fundus, and might be adhesed. There is no regional inflammation or gas within the endometrial cavity, but there is vaginal gas. If there is unexplained vaginal discharge then consider the possibility of a fistula. 2. No other acute or inflammatory process identified in the abdomen or pelvis. Stable benign appearing hepatic and splenic cysts and/or hemangiomas. Mild aortic atherosclerosis. Electronically Signed   By: Odessa FlemingH  Hall M.D.   On: 11/10/2020 04:52     ____________________________________________   PROCEDURES  Procedure(s) performed:yes .1-3 Lead EKG Interpretation  Date/Time: 11/10/2020 4:37 AM Performed by: Nita SickleVeronese, [place], MD Authorized by: Nita SickleVeronese, [place], MD     Interpretation: normal     ECG rate assessment: normal     Rhythm: sinus rhythm     Ectopy: none  Conduction: normal     Critical Care performed:  None ____________________________________________   INITIAL IMPRESSION / ASSESSMENT AND PLAN / ED COURSE  65 y.o. female with history of diverticular disease complicated by perforation followed by partial colectomy, asthma who presents for evaluation of abdominal pain.  Patient with 2 weeks of intermittent and constant epigastric abdominal pain.  She is well-appearing in no distress with normal vital signs.  She is tender to palpation to epigastric and left upper quadrant with no rebound or guarding.  She has positive bowel sounds and no distention.  Ddx gallbladder pathology versus peptic ulcer disease versus GERD versus gastritis versus pancreatitis versus diverticulitis versus mesenteric ischemia.  Patient given fentanyl and Zofran for symptom relief.  CT and right upper quadrant ultrasound are pending.  _________________________ 7:20 AM on 11/10/2020 -----------------------------------------  Labs  with normal lipase, normal LFTs, normal chemistry panel, normal white count, no signs of anemia.  Troponin and EKG with no signs of ischemia.  CT abdomen pelvis showing no acute pathology.  As soon as CT was read patient started having diarrhea therefore possibly a diarrheal illness.  Right upper quadrant ultrasound is pending.  If that is negative anticipate discharge home with follow-up with her surgeon.  Care transferred to Dr. Cyril Loosen at 7 AM.  _____________________________________________ Please note:  Patient was evaluated in Emergency Department today for the symptoms described in the history of present illness. Patient was evaluated in the context of the global COVID-19 pandemic, which necessitated consideration that the patient might be at risk for infection with the SARS-CoV-2 virus that causes COVID-19. Institutional protocols and algorithms that pertain to the evaluation of patients at risk for COVID-19 are in a state of rapid change based on information released by regulatory bodies including the CDC and federal and state organizations. These policies and algorithms were followed during the patient's care in the ED.  Some ED evaluations and interventions may be delayed as a result of limited staffing during the pandemic.   [place] Controlled Substance Database was reviewed by me. ____________________________________________   FINAL CLINICAL IMPRESSION(S) / ED DIAGNOSES   Final diagnoses:  Epigastric abdominal pain      NEW MEDICATIONS STARTED DURING THIS VISIT:  ED Discharge Orders          Ordered    Ambulatory referral to Gastroenterology        11/10/20 0727    dicyclomine (BENTYL) 10 MG capsule  4 times daily        11/10/20 0728    ondansetron (ZOFRAN ODT) 4 MG disintegrating tablet  Every 8 hours PRN        11/10/20 0728    pantoprazole (PROTONIX) 20 MG tablet  Daily        11/10/20 6503             Note:  This document was prepared using Dragon voice recognition  software and may include unintentional dictation errors.    Nita Sickle, MD 11/10/20 [phone]

## 2020-11-10 NOTE — ED Notes (Signed)
Patient transferred to C pod via wheelchair for continued care in ER.

## 2020-11-10 NOTE — Progress Notes (Signed)
Patient ambulated to the bathroom without difficulty.

## 2020-11-11 ENCOUNTER — Encounter: Payer: Self-pay | Admitting: Gastroenterology

## 2020-11-11 DIAGNOSIS — R1013 Epigastric pain: Secondary | ICD-10-CM | POA: Diagnosis not present

## 2020-11-11 LAB — CBC
HCT: 38.6 % (ref 36.0–46.0)
Hemoglobin: 13 g/dL (ref 12.0–15.0)
MCH: 30.5 pg (ref 26.0–34.0)
MCHC: 33.7 g/dL (ref 30.0–36.0)
MCV: 90.6 fL (ref 80.0–100.0)
Platelets: 183 10*3/uL (ref 150–400)
RBC: 4.26 MIL/uL (ref 3.87–5.11)
RDW: 14.2 % (ref 11.5–15.5)
WBC: 6.3 10*3/uL (ref 4.0–10.5)
nRBC: 0 % (ref 0.0–0.2)

## 2020-11-11 LAB — BASIC METABOLIC PANEL
Anion gap: 3 — ABNORMAL LOW (ref 5–15)
BUN: 10 mg/dL (ref 8–23)
CO2: 28 mmol/L (ref 22–32)
Calcium: 8.7 mg/dL — ABNORMAL LOW (ref 8.9–10.3)
Chloride: 109 mmol/L (ref 98–111)
Creatinine, Ser: 0.82 mg/dL (ref 0.44–1.00)
GFR, Estimated: >60 mL/min (ref >60)
Glucose, Bld: 99 mg/dL (ref 70–99)
Potassium: 4.1 mmol/L (ref 3.5–5.1)
Sodium: 140 mmol/L (ref 135–145)

## 2020-11-11 LAB — GLUCOSE, CAPILLARY: Glucose-Capillary: 88 mg/dL (ref 70–99)

## 2020-11-11 MED ORDER — PANTOPRAZOLE SODIUM 40 MG PO TBEC: DELAYED_RELEASE_TABLET | ORAL | 0 refills | Status: DC

## 2020-11-11 NOTE — Plan of Care (Signed)
Continuing with plan of care. 

## 2020-11-11 NOTE — Progress Notes (Addendum)
[facility] SURGICAL PROGRESS NOTE (cpt [phone])  Hospital Day(s): 1.   Interval History: Patient seen and examined, no acute events or new complaints overnight. Patient reports she is feeling much better this morning. Her abdominal pain has now resolved. No nausea, emesis.  Her labs this morning are grossly normal. She underwent EGD with Dr Servando Snare yesterday which showed single non-bleeding duodenal erosion. She was started on PPI yesterday on admission. She was advanced to soft diet, and she was able to tolerate this without issue.   Review of Systems:  Constitutional: denies fever, chills  HEENT: denies cough or congestion  Respiratory: denies any shortness of breath  Cardiovascular: denies chest pain or palpitations  Gastrointestinal: denies abdominal pain, N/V, or diarrhea/and bowel function as per interval history Genitourinary: denies burning with urination or urinary frequency  Vital signs in last 24 hours: [min-max] current  Temp:  [96.5 F (35.8 C)-98.1 F (36.7 C)] 98.1 F (36.7 C) (08/26 0459) Pulse Rate:  [57-99] 57 (08/26 0459) Resp:  [15-20] 20 (08/26 0459) BP: (98-136)/(48-111) 98/62 (08/26 0459) SpO2:  [94 %-100 %] 100 % (08/26 0459) Weight:  [73.9 kg] 73.9 kg (08/25 1602)     Height: 5\' 1"  (154.9 cm) Weight: 73.9 kg BMI (Calculated): 30.81   Intake/Output last 2 shifts:  No intake/output data recorded.   Physical Exam:  Constitutional: alert, cooperative and no distress  HENT: normocephalic without obvious abnormality  Eyes: PERRL, EOM's grossly intact and symmetric  Respiratory: breathing non-labored at rest  Cardiovascular: regular rate and sinus rhythm  Gastrointestinal: soft, non-tender, and non-distended, no rebound/guarding Musculoskeletal: no edema or wounds, motor and sensation grossly intact, NT    Labs:  CBC Latest Ref Rng & Units 11/11/2020 11/09/2020 11/07/2020  WBC 4.0 - 10.5 K/uL 6.3 7.9 7.9  Hemoglobin 12.0 - 15.0 g/dL 11/09/2020 76.2 26.3   Hematocrit 36.0 - 46.0 % 38.6 41.4 43.5  Platelets 150 - 400 K/uL 183 230 232   CMP Latest Ref Rng & Units 11/11/2020 11/09/2020 11/07/2020  Glucose 70 - 99 mg/dL 99 11/09/2020) 456(Y)  BUN 8 - 23 mg/dL 10 11 19   Creatinine 0.44 - 1.00 mg/dL 563(S 9.37  Sodium 135 - 145 mmol/L 140 138 141  Potassium 3.5 - 5.1 mmol/L 4.1 4.0 4.0  Chloride 98 - 111 mmol/L 109 108 109  CO2 22 - 32 mmol/L 28 21(L) 26  Calcium 8.9 - 10.3 mg/dL 3.42) 9.5 9.5  Total Protein 6.5 - 8.1 g/dL - 7.4 7.6  Total Bilirubin 0.3 - 1.2 mg/dL - 0.7 0.9  Alkaline Phos 38 - 126 U/L - 58 58  AST 15 - 41 U/L - 26 27  ALT 0 - 44 U/L - 18 23     Imaging studies: No new pertinent imaging studies   Assessment/Plan: (ICD-10's: R10.13) 65 y.o. female with presenting with epigastric and LUQ abdominal pain of unknown etiology, potentially duodenal erosion on EGD, otherwise unclear   - Appreciate GI assistance with EGD  - Okay to continue diet as tolerated - No surgical intervention warranted   - Continue PPI BID; recommend discharge with same  - Monitor abdominal examination - Pain control prn             - Further management per primary service     - Discharge Planning: Okay for discharge from surgical perspective; PPI as for home as above, follow up with Dr 8.1(L in 1 week to close loop    All of the above findings and  recommendations were discussed with the patient, and the medical team, and all of patient's questions were answered to her expressed satisfaction.  -- Lynden Oxford, PA-C [facility] 11/11/2020, 7:30 AM [phone] M-F: 7am - 4pm

## 2020-11-11 NOTE — Discharge Summary (Signed)
Physician Discharge Summary  Monica Mckenzie ZOX:[id number] DOB: 07/13/1955 DOA: 11/10/2020  PCP: Kandyce Rud, MD  Admit date: 11/10/2020 Discharge date: 11/11/2020  Admitted From: Home Disposition: Home  Recommendations for Outpatient Follow-up:  Follow up with PCP in 1-2 weeks Follow-up next week with general surgery  Home Health: No Equipment/Devices: None  Discharge Condition: Stable CODE STATUS: Full Diet recommendation: Bland/soft  Brief/Interim Summary:  65 y.o. female with medical history significant of asthma, allergy, diverticulitis with perforation, s/p of a partial colectomy, s/p of colostomy takedown, who presents with abdominal pain.   Patient states that she has intermittent abdominal pain for more than 2 weeks.  It is located in epigastric area, worse on the left upper quadrant, at worst time 9 out of 10 in severity, sharp, radiating to the left back, currently 4 out of 10 in severity.  Patient does not have nausea, vomiting or diarrhea.  No fever, but has chills.  Denies chest pain, cough, shortness of breath.  No symptoms of UTI. Patient states that she took Advil for many years in the past, but currently not taking NSAIDs.  No dark stool or rectal bleeding.  Denies symptoms of acid reflux.  General surgery consulted and recommended GI evaluation for suspected peptic ulcer disease.  Patient underwent EGD on 8/25.  No peptic ulcer identified.  Some gastritis noted.  Recommend twice daily PPI.  On day of discharge patient is hemodynamically stable.  She is tolerating p.o. intake.  No abdominal pain.  No nausea or vomiting.  Stable for discharge home.  Twice daily PPI x1 week, followed by daily PPI indefinitely.  Patient will follow up with PCP and general surgery post discharge.   Discharge Diagnoses:  Principal Problem:   Abdominal pain Active Problems:   Asthma  Abdominal pain Etiology not entirely clear.  General surgery has low suspicion for surgical  complications.  No GI performed EGD.  Duodenal erosion without bleeding.  Irregular Z-line and GE junction.  Possible reflux induced.  At time of discharge will recommend twice daily PPI x1 week followed by daily PPI indefinitely.  Patient stable for discharge at this time.  We will follow-up with PCP and general surgery at time of discharge.  Discharge Instructions  Discharge Instructions     Ambulatory referral to Gastroenterology   Complete by: As directed    Diet - low sodium heart healthy   Complete by: As directed    Increase activity slowly   Complete by: As directed       Allergies as of 11/11/2020   No Known Allergies      Medication List     TAKE these medications    acetaminophen 500 MG tablet Commonly known as: TYLENOL Take 500 mg by mouth every 6 (six) hours as needed for moderate pain or headache.   AZO CRANBERRY URINARY TRACT PO Take 2 tablets by mouth daily.   CALCIUM 600+D PLUS MINERALS PO Take 1 tablet by mouth daily.   CENTRUM SILVER ULTRA WOMENS PO Take 1 tablet by mouth daily.   COLLAGEN-VITAMIN C PO Take 4 tablets by mouth daily.   pantoprazole 40 MG tablet Commonly known as: Protonix Take 1 tablet (40 mg total) by mouth 2 (two) times daily for 7 days, THEN 1 tablet (40 mg total) daily. See your PCP for refills. Start taking on: November 11, 2020        Follow-up Information     Kandyce Rud, MD. Go in 2 day(s).   Specialty: Family  Medicine Why: they will call patient with appointment Contact information: [address] [facility] [place] [address] [phone]         Leafy Ro, MD. Schedule an appointment as soon as possible for a visit on 11/23/2020.   Specialty: General Surgery Why: 4:30pm appointment Contact information: [address] [place] [address] [phone]                No Known Allergies  Consultations: General  surgery GI   Procedures/Studies: CT ABDOMEN PELVIS W CONTRAST  Result Date: 11/10/2020 CLINICAL DATA:  65 year old female with 2 weeks of epigastric abdominal pain. History of diverticulitis with perforation, colostomy and colostomy reversal this year. EXAM: CT ABDOMEN AND PELVIS WITH CONTRAST TECHNIQUE: Multidetector CT imaging of the abdomen and pelvis was performed using the standard protocol following bolus administration of intravenous contrast. CONTRAST:  OMNIPAQUE IOHEXOL 350 MG/ML SOLN COMPARISON:  CT Abdomen and Pelvis 03/29/2020. FINDINGS: Lower chest: Mild bilateral lower lobe scarring is stable. Otherwise negative. Hepatobiliary: Stable liver with 2 or 3 small circumscribed low-density areas, the largest in the right lobe on series 2, image 15 has simple fluid density compatible with a benign cyst. Mild hepatic steatosis. Negative gallbladder. Pancreas: Negative. Spleen: Circumscribed round low-density splenic lesion at the anterior pole is stable measuring 2 cm and has simple fluid density. There is a similar size stable small area of round hyperenhancement in the mid spleen on image 19. Both of these appear benign. Adrenals/Urinary Tract: Normal adrenal glands. Stable and negative kidneys. Symmetric renal enhancement and contrast excretion with decompressed ureters. Diminutive and unremarkable bladder. Stomach/Bowel: Negative rectum. Sigmoid colon anastomosis on series 2, image 59 with no adverse features. Decompressed upstream large bowel with occasional diverticula. Negative transverse colon. Negative right colon with evidence of interval appendectomy. No dilated small bowel. Decompressed stomach and duodenum. No free air or free fluid. Postoperative changes to the ventral abdominal wall both in the midline and in both lower quadrants. No postoperative fluid collection. Vascular/Lymphatic: Mild Aortoiliac calcified atherosclerosis. Major arterial structures are patent. No  lymphadenopathy. Portal venous system is patent. Reproductive: The cecum is inseparable from the right adnexa and uterine fundus now, in the area of appendectomy. There is no gas identified within the endometrium. But there is prominent gas within the vagina and vaginal fornix. Still, no regional inflammation. Other: No pelvic free fluid. Musculoskeletal: No acute osseous abnormality identified. IMPRESSION: 1. Postoperative changes since January of appendectomy, distal partial colectomy, colostomy and colostomy reversal. No bowel obstruction or inflammation. However, the cecum is now inseparable from the right adnexa and uterine fundus, and might be adhesed. There is no regional inflammation or gas within the endometrial cavity, but there is vaginal gas. If there is unexplained vaginal discharge then consider the possibility of a fistula. 2. No other acute or inflammatory process identified in the abdomen or pelvis. Stable benign appearing hepatic and splenic cysts and/or hemangiomas. Mild aortic atherosclerosis. Electronically Signed   By: Odessa Fleming M.D.   On: 11/10/2020 04:52   US ABDOMEN LIMITED RUQ (LIVER/GB)  Result Date: 11/10/2020 CLINICAL DATA:  65 year old female with 2 weeks of epigastric pain. EXAM: ULTRASOUND ABDOMEN LIMITED RIGHT UPPER QUADRANT COMPARISON:  CT Abdomen and Pelvis 0427 hours today. FINDINGS: Gallbladder: Ring down artifact from the mildly thickened anterior gallbladder wall (image 19). But in general the gallbladder wall thickness is normal. Small 4 mm nonshadowing stone, versus adherent sludge or small gallbladder  polyp on image 18. No pericholecystic fluid. No sonographic Murphy sign elicited. Common bile duct: Diameter: 4 mm, normal. Liver: Small hepatic cysts redemonstrated, the largest on image 42 is 2 cm and appears benign with increased through transmission. No suspicious liver lesion. But the liver is mildly echogenic (image 38). Portal vein is patent on color Doppler imaging  with normal direction of blood flow towards the liver. Other: Negative visible right kidney. IMPRESSION: 1. Evidence of gallbladder Adenomyomatosis. And there is a small focus of adherent sludge versus nonshadowing stone. But no evidence of acute cholecystitis. 2. No evidence of bile duct obstruction. Benign appearing hepatic cysts. Electronically Signed   By: Odessa Fleming M.D.   On: 11/10/2020 07:39   (Echo, Carotid, EGD, Colonoscopy, ERCP)    Subjective: Seen and examined on the day of discharge.  Husband at bedside.  Patient stable no distress.  Answers all questions appropriately.  Abdominal pain resolved.  Discharge Exam: Vitals:   11/11/20 0746 11/11/20 0752  BP: (!) 108/58 130/60  Pulse: (!) 53 84  Resp: 18 18  Temp: 97.8 F (36.6 C) 97.6 F (36.4 C)  SpO2: 100% 100%   Vitals:   11/10/20 1937 11/11/20 0459 11/11/20 0746 11/11/20 0752  BP: 136/63 98/62 (!) 108/58 130/60  Pulse: 64 (!) 57 (!) 53 84  Resp: 18 20 18 18   Temp: 98.1 F (36.7 C) 98.1 F (36.7 C) 97.8 F (36.6 C) 97.6 F (36.4 C)  TempSrc: Oral Oral Oral Oral  SpO2: 99% 100% 100% 100%  Weight:      Height:        General: Pt is alert, awake, not in acute distress Cardiovascular: RRR, S1/S2 +, no rubs, no gallops Respiratory: CTA bilaterally, no wheezing, no rhonchi Abdominal: Soft, NT, ND, bowel sounds + Extremities: no edema, no cyanosis    The results of significant diagnostics from this hospitalization (including imaging, microbiology, ancillary and laboratory) are listed below for reference.     Microbiology: Recent Results (from the past 240 hour(s))  Resp Panel by RT-PCR (Flu A&B, Covid) Nasopharyngeal Swab     Status: None   Collection Time: 11/10/20  1:36 PM   Specimen: Nasopharyngeal Swab; Nasopharyngeal(NP) swabs in vial transport medium  Result Value Ref Range Status   SARS Coronavirus 2 by RT PCR NEGATIVE NEGATIVE Final    Comment: (NOTE) SARS-CoV-2 target nucleic acids are NOT  DETECTED.  The SARS-CoV-2 RNA is generally detectable in upper respiratory specimens during the acute phase of infection. The lowest concentration of SARS-CoV-2 viral copies this assay can detect is 138 copies/mL. A negative result does not preclude SARS-Cov-2 infection and should not be used as the sole basis for treatment or other patient management decisions. A negative result may occur with  improper specimen collection/handling, submission of specimen other than nasopharyngeal swab, presence of viral mutation(s) within the areas targeted by this assay, and inadequate number of viral copies(<138 copies/mL). A negative result must be combined with clinical observations, patient history, and epidemiological information. The expected result is Negative.  Fact Sheet for Patients:  11/12/20  Fact Sheet for Healthcare Providers:  BloggerCourse.com  This test is no t yet approved or cleared by the [place] FDA and  has been authorized for detection and/or diagnosis of SARS-CoV-2 by FDA under an Emergency Use Authorization (EUA). This EUA will remain  in effect (meaning this test can be used) for the duration of the COVID-19 declaration under Section 564(b)(1) of the Act, 21 U.S.C.section 360bbb-3(b)(1), unless  the authorization is terminated  or revoked sooner.       Influenza A by PCR NEGATIVE NEGATIVE Final   Influenza B by PCR NEGATIVE NEGATIVE Final    Comment: (NOTE) The Xpert Xpress SARS-CoV-2/FLU/RSV plus assay is intended as an aid in the diagnosis of influenza from Nasopharyngeal swab specimens and should not be used as a sole basis for treatment. Nasal washings and aspirates are unacceptable for Xpert Xpress SARS-CoV-2/FLU/RSV testing.  Fact Sheet for Patients: BloggerCourse.comhttps://www.fda.gov/media/152166/download  Fact Sheet for Healthcare Providers: SeriousBroker.ithttps://www.fda.gov/media/152162/download  This test is not yet  approved or cleared by the [place] FDA and has been authorized for detection and/or diagnosis of SARS-CoV-2 by FDA under an Emergency Use Authorization (EUA). This EUA will remain in effect (meaning this test can be used) for the duration of the COVID-19 declaration under Section 564(b)(1) of the Act, 21 U.S.C. section 360bbb-3(b)(1), unless the authorization is terminated or revoked.  Performed at [facility], [address] [place], [place] [id number]      Labs: BNP (last 3 results) No results for input(s): BNP in the last 8760 hours. Basic Metabolic Panel: Recent Labs  Lab 11/07/20 1107 11/09/20 2306 11/11/20 0604  NA 141 138 140  K 4.0 4.0 4.1  CL 109 108 109  CO2 26 21* 28  GLUCOSE 117* 114* 99  BUN 19 11 10   CREATININE 0.81 0.60 0.82  CALCIUM 9.5 9.5 8.7*   Liver Function Tests: Recent Labs  Lab 11/07/20 1107 11/09/20 2306  AST 27 26  ALT 23 18  ALKPHOS 58 58  BILITOT 0.9 0.7  PROT 7.6 7.4  ALBUMIN 4.0 3.6   Recent Labs  Lab 11/07/20 1107 11/09/20 2306  LIPASE 31 32   No results for input(s): AMMONIA in the last 168 hours. CBC: Recent Labs  Lab 11/07/20 1107 11/09/20 2306 11/11/20 0604  WBC 7.9 7.9 6.3  NEUTROABS 5.1  --   --   HGB 14.6 14.2 13.0  HCT 43.5 41.4 38.6  MCV 89.5 88.1 90.6  PLT 232 230 183   Cardiac Enzymes: No results for input(s): CKTOTAL, CKMB, CKMBINDEX, TROPONINI in the last 168 hours. BNP: Invalid input(s): POCBNP CBG: Recent Labs  Lab 11/11/20 0748  GLUCAP 88   D-Dimer No results for input(s): DDIMER in the last 72 hours. Hgb A1c No results for input(s): HGBA1C in the last 72 hours. Lipid Profile No results for input(s): CHOL, HDL, LDLCALC, TRIG, CHOLHDL, LDLDIRECT in the last 72 hours. Thyroid function studies No results for input(s): TSH, T4TOTAL, T3FREE, THYROIDAB in the last 72 hours.  Invalid input(s): FREET3 Anemia work up No results for input(s): VITAMINB12, FOLATE, FERRITIN, TIBC,  IRON, RETICCTPCT in the last 72 hours. Urinalysis    Component Value Date/Time   COLORURINE YELLOW (A) 03/29/2020 1335   APPEARANCEUR CLEAR (A) 03/29/2020 1335   LABSPEC >1.046 (H) 03/29/2020 1335   PHURINE 5.0 03/29/2020 1335   GLUCOSEU NEGATIVE 03/29/2020 1335   HGBUR SMALL (A) 03/29/2020 1335   BILIRUBINUR NEGATIVE 03/29/2020 1335   BILIRUBINUR negative 04/07/2015 0958   KETONESUR 20 (A) 03/29/2020 1335   PROTEINUR NEGATIVE 03/29/2020 1335   UROBILINOGEN 0.2 04/07/2015 0958   NITRITE NEGATIVE 03/29/2020 1335   LEUKOCYTESUR NEGATIVE 03/29/2020 1335   Sepsis Labs Invalid input(s): PROCALCITONIN,  WBC,  LACTICIDVEN Microbiology Recent Results (from the past 240 hour(s))  Resp Panel by RT-PCR (Flu A&B, Covid) Nasopharyngeal Swab     Status: None   Collection Time: 11/10/20  1:36 PM  Specimen: Nasopharyngeal Swab; Nasopharyngeal(NP) swabs in vial transport medium  Result Value Ref Range Status   SARS Coronavirus 2 by RT PCR NEGATIVE NEGATIVE Final    Comment: (NOTE) SARS-CoV-2 target nucleic acids are NOT DETECTED.  The SARS-CoV-2 RNA is generally detectable in upper respiratory specimens during the acute phase of infection. The lowest concentration of SARS-CoV-2 viral copies this assay can detect is 138 copies/mL. A negative result does not preclude SARS-Cov-2 infection and should not be used as the sole basis for treatment or other patient management decisions. A negative result may occur with  improper specimen collection/handling, submission of specimen other than nasopharyngeal swab, presence of viral mutation(s) within the areas targeted by this assay, and inadequate number of viral copies(<138 copies/mL). A negative result must be combined with clinical observations, patient history, and epidemiological information. The expected result is Negative.  Fact Sheet for Patients:  BloggerCourse.com  Fact Sheet for Healthcare Providers:   SeriousBroker.it  This test is no t yet approved or cleared by the [place] FDA and  has been authorized for detection and/or diagnosis of SARS-CoV-2 by FDA under an Emergency Use Authorization (EUA). This EUA will remain  in effect (meaning this test can be used) for the duration of the COVID-19 declaration under Section 564(b)(1) of the Act, 21 U.S.C.section 360bbb-3(b)(1), unless the authorization is terminated  or revoked sooner.       Influenza A by PCR NEGATIVE NEGATIVE Final   Influenza B by PCR NEGATIVE NEGATIVE Final    Comment: (NOTE) The Xpert Xpress SARS-CoV-2/FLU/RSV plus assay is intended as an aid in the diagnosis of influenza from Nasopharyngeal swab specimens and should not be used as a sole basis for treatment. Nasal washings and aspirates are unacceptable for Xpert Xpress SARS-CoV-2/FLU/RSV testing.  Fact Sheet for Patients: BloggerCourse.com  Fact Sheet for Healthcare Providers: SeriousBroker.it  This test is not yet approved or cleared by the [place] FDA and has been authorized for detection and/or diagnosis of SARS-CoV-2 by FDA under an Emergency Use Authorization (EUA). This EUA will remain in effect (meaning this test can be used) for the duration of the COVID-19 declaration under Section 564(b)(1) of the Act, 21 U.S.C. section 360bbb-3(b)(1), unless the authorization is terminated or revoked.  Performed at [facility], [address] [place], [place] [address]      Time coordinating discharge: Over 30 minutes  SIGNED:   Tresa Moore, MD  [facility] 11/11/2020, 12:11 PM Pager   If 7PM-7AM, please contact night-coverage

## 2020-11-11 NOTE — Plan of Care (Signed)
Discharge teaching completed with patient who is in stable condition. 

## 2020-11-15 ENCOUNTER — Encounter: Payer: Self-pay | Admitting: *Deleted

## 2020-11-15 ENCOUNTER — Telehealth: Payer: Self-pay | Admitting: Gastroenterology

## 2020-11-15 NOTE — Telephone Encounter (Signed)
Patient called and is not sure if she needs a f/u appt with Dr. Servando Snare or general surgery. She is a follow up hospital patient. Please advise.

## 2020-11-15 NOTE — Telephone Encounter (Signed)
Spoke with pt and advise her per the hospital discharge summary, she is to follow up with PCP and general surgery.

## 2020-11-16 ENCOUNTER — Ambulatory Visit (INDEPENDENT_AMBULATORY_CARE_PROVIDER_SITE_OTHER): Payer: Medicare HMO | Admitting: Surgery

## 2020-11-16 ENCOUNTER — Encounter: Payer: Self-pay | Admitting: Surgery

## 2020-11-16 ENCOUNTER — Other Ambulatory Visit: Payer: Self-pay

## 2020-11-16 VITALS — BP 127/69 | HR 70 | Temp 98.1°F | Ht 61.0 in | Wt 165.0 lb

## 2020-11-16 DIAGNOSIS — R1084 Generalized abdominal pain: Secondary | ICD-10-CM

## 2020-11-16 MED ORDER — PANTOPRAZOLE SODIUM 40 MG PO TBEC: 40.0000 mg | DELAYED_RELEASE_TABLET | Freq: Every day | ORAL | 2 refills | Status: AC

## 2020-11-16 NOTE — Progress Notes (Signed)
Outpatient Surgical Follow Up  11/16/2020  Monica Mckenzie is an 65 y.o. female.   Chief Complaint  Patient presents with   Follow-up    Seen in ER on 11/10/2020 - post colostomy takedown 08/09/20, abdominal pain, CT and US done prior    HPI: Litha is well-known to me she recently was admitted to the hospital due to abdominal pain that was in the epigastric and left upper quadrant region.  She seems to have association with some tomato derived foods.  She reports that the pain is much better.  She had an endoscopy that have personally reviewed as well as a CT scan of the abdomen and pelvis.  She also had an ultrasound of the right upper quadrant.  The work-up revealed evidence of some gastritis and there is evidence of gallbladder polyp and some sludge.  There is no evidence of cholecystitis there is no evidence of any anastomotic leak or any other acute intra-abdominal pathology.  She is taking p.o. well.  She is having bowel movements.  No fevers no chills.  Past Medical History:  Diagnosis Date   Allergy    Asthma    ALLERGY RELATED   Complication of anesthesia    NAUSEA   Family history of adverse reaction to anesthesia    FATHER HAS PONV    Past Surgical History:  Procedure Laterality Date   COLECTOMY WITH COLOSTOMY CREATION/HARTMANN PROCEDURE N/A 03/29/2020   Procedure: COLECTOMY WITH COLOSTOMY CREATION/HARTMANN PROCEDURE;  Surgeon: Leafy Ro, MD;  Location: [facility] ORS;  Service: General;  Laterality: N/A;   COLONOSCOPY  2012   cleared for 10 yrs- Dr Bluford Kaufmann   COLONOSCOPY WITH PROPOFOL N/A 07/12/2020   Procedure: COLONOSCOPY WITH PROPOFOL THROUGH COLOSTOMY;  Surgeon: Midge Minium, MD;  Location: [facility] ENDOSCOPY;  Service: Endoscopy;  Laterality: N/A;   DENTAL WORK     ESOPHAGOGASTRODUODENOSCOPY (EGD) WITH PROPOFOL N/A 11/10/2020   Procedure: ESOPHAGOGASTRODUODENOSCOPY (EGD) WITH PROPOFOL;  Surgeon: Midge Minium, MD;  Location: [facility] ENDOSCOPY;  Service: Endoscopy;  Laterality: N/A;    INCISIONAL HERNIA REPAIR N/A 08/09/2020   Procedure: HERNIA REPAIR INCISIONAL;  Surgeon: Leafy Ro, MD;  Location: [facility] ORS;  Service: General;  Laterality: N/A;   LAPAROSCOPIC APPENDECTOMY  08/09/2020   Procedure: APPENDECTOMY LAPAROSCOPIC;  Surgeon: Leafy Ro, MD;  Location: [facility] ORS;  Service: General;;   XI ROBOTIC ASSISTED COLOSTOMY TAKEDOWN N/A 08/09/2020   Procedure: XI ROBOTIC ASSISTED COLOSTOMY TAKEDOWN with RNFA to assist;  Surgeon: Leafy Ro, MD;  Location: [facility] ORS;  Service: General;  Laterality: N/A;    Family History  Problem Relation Age of Onset   Cancer Mother    Breast cancer Mother 88   Diabetes Father    Hypertension Father     Social History:  reports that she has quit smoking. She has never used smokeless tobacco. She reports that she does not currently use alcohol. She reports that she does not use drugs.  Allergies: No Known Allergies  Medications reviewed.    ROS Full ROS performed and is otherwise negative other than what is stated in HPI   BP 127/69   Pulse 70   Temp 98.1 F (36.7 C) (Oral)   Ht 5\' 1"  (1.549 m)   Wt 165 lb (74.8 kg)   SpO2 96%   BMI 31.18 kg/m   Physical Exam Vitals and nursing note reviewed. Exam conducted with a chaperone present.  Constitutional:      Appearance: Normal appearance.  Cardiovascular:  Rate and Rhythm: Normal rate and regular rhythm.  Pulmonary:     Effort: Pulmonary effort is normal. No respiratory distress.  Abdominal:     General: Abdomen is flat. There is no distension.     Palpations: Abdomen is soft. There is no mass.     Tenderness: There is no abdominal tenderness. There is no guarding or rebound.     Hernia: No hernia is present.  Musculoskeletal:        General: Normal range of motion.  Skin:    General: Skin is warm and dry.     Capillary Refill: Capillary refill takes less than 2 seconds.  Neurological:     General: No focal deficit present.     Mental Status: She is  alert and oriented to person, place, and time.  Psychiatric:        Mood and Affect: Mood normal.        Behavior: Behavior normal.        Thought Content: Thought content normal.        Judgment: Judgment normal.       No results found for this or any previous visit (from the past 48 hour(s)). No results found.  Assessment/Plan: 65 year old female with a prior history of Hartman's procedure and colostomy takedown.  Recent admission for abdominal pain that is in the upper abdomen and differential include peptic ulcer disease and gastritis versus biliary colic.  At this time it is not a clear-cut.  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  I encouraged her to complete PPI course.  I actually gave her a prescription for Protonix.  I will see her back in about a month or so and then reassess her abdominal pain and the potential need for any other interventions.  Extensive counseling provided   Greater than 50% of the 30  minutes  visit was spent in counseling/coordination of care   Sterling Big, MD [facility] General Surgeon

## 2020-11-16 NOTE — Patient Instructions (Signed)
A refill on the Protonix has been sent to your pharmacy.   If you have any concerns or questions, please feel free to call our office. See follow up appointment below.

## 2020-11-17 ENCOUNTER — Ambulatory Visit: Payer: Medicare HMO

## 2020-11-22 ENCOUNTER — Ambulatory Visit: Payer: Medicare HMO

## 2020-11-23 ENCOUNTER — Encounter: Payer: Self-pay | Admitting: Surgery

## 2020-11-23 ENCOUNTER — Ambulatory Visit: Payer: Medicare HMO | Admitting: Surgery

## 2020-11-23 DIAGNOSIS — K2900 Acute gastritis without bleeding: Secondary | ICD-10-CM | POA: Diagnosis not present

## 2020-11-28 ENCOUNTER — Ambulatory Visit: Payer: Medicare HMO | Admitting: Surgery

## 2020-11-30 ENCOUNTER — Ambulatory Visit: Payer: Medicare HMO | Admitting: Surgery

## 2020-12-19 ENCOUNTER — Ambulatory Visit: Payer: Medicare HMO | Admitting: Surgery

## 2021-01-04 ENCOUNTER — Encounter: Payer: Self-pay | Admitting: Surgery

## 2021-01-04 ENCOUNTER — Other Ambulatory Visit: Payer: Self-pay

## 2021-01-04 ENCOUNTER — Ambulatory Visit: Payer: Medicare HMO | Admitting: Surgery

## 2021-01-04 VITALS — BP 131/79 | HR 80 | Temp 98.8°F | Ht 61.0 in | Wt 170.0 lb

## 2021-01-04 DIAGNOSIS — Z8719 Personal history of other diseases of the digestive system: Secondary | ICD-10-CM

## 2021-01-04 DIAGNOSIS — Z09 Encounter for follow-up examination after completed treatment for conditions other than malignant neoplasm: Secondary | ICD-10-CM

## 2021-01-04 DIAGNOSIS — K5720 Diverticulitis of large intestine with perforation and abscess without bleeding: Secondary | ICD-10-CM

## 2021-01-04 NOTE — Patient Instructions (Addendum)
Follow your primary care providers recommendations for your Protonix.  Follow-up with our office as needed.  You should have a repeat Colonoscopy in about 5 years.    Please call and ask to speak with a nurse if you develop questions or concerns.

## 2021-01-05 ENCOUNTER — Encounter: Payer: Self-pay | Admitting: Surgery

## 2021-01-05 NOTE — Progress Notes (Signed)
Outpatient Surgical Follow Up  01/05/2021  Monica Mckenzie is an 65 y.o. female.   CC Abd pain  HPI: Monica Mckenzie is a 65 year old female well-known to me with a prior history of Hartman's procedure for perforated diverticulitis.  She had a colostomy takedown.  She is doing much better.  She did have a few weeks ago some abdominal pain and ultrasound showing evidence of sludge .  She also was scoped and found to have some gastritis.  She was started on PPI with significant improvement in symptoms.  Currently she is symptom-free.  She is tolerating diet.  She is ambulating back to her normal self. Past Medical History:  Diagnosis Date   Allergy    Asthma    ALLERGY RELATED   Complication of anesthesia    NAUSEA   Family history of adverse reaction to anesthesia    FATHER HAS PONV    Past Surgical History:  Procedure Laterality Date   COLECTOMY WITH COLOSTOMY CREATION/HARTMANN PROCEDURE N/A 03/29/2020   Procedure: COLECTOMY WITH COLOSTOMY CREATION/HARTMANN PROCEDURE;  Surgeon: Leafy Ro, MD;  Location: [facility] OR[facility];  Service: General;  Laterality: N/A;   COLONOSCOPY  2012   cleared for 10 yrs- Dr Bluford Kaufmann   COLONOSCOPY WITH PROPOFOL N/A 07/12/2020   Procedure: COLONOSCOPY WITH PROPOFOL THROUGH COLOSTOMY;  Surgeon: Midge Minium, MD;  Location: [facility] ENDOSCOPY;  Service: Endoscopy;  Laterality: N/A;   DENTAL WORK     ESOPHAGOGASTRODUODENOSCOPY (EGD) WITH PROPOFOL N/A 11/10/2020   Procedure: ESOPHAGOGASTRODUODENOSCOPY (EGD) WITH PROPOFOL;  Surgeon: Midge Minium, MD;  Location: [facility] ENDOSCOPY;  Service: Endoscopy;  Laterality: N/A;   INCISIONAL HERNIA REPAIR N/A 08/09/2020   Procedure: HERNIA REPAIR INCISIONAL;  Surgeon: Leafy Ro, MD;  Location: [facility] ORS;  Service: General;  Laterality: N/A;   LAPAROSCOPIC APPENDECTOMY  08/09/2020   Procedure: APPENDECTOMY LAPAROSCOPIC;  Surgeon: Leafy Ro, MD;  Location: [facility] OR[facility];  Service: General;;   XI ROBOTIC ASSISTED COLOSTOMY TAKEDOWN N/A 08/09/2020    Procedure: XI ROBOTIC ASSISTED COLOSTOMY TAKEDOWN with RNFA to assist;  Surgeon: Leafy Ro, MD;  Location: [facility] OR[facility];  Service: General;  Laterality: N/A;    Family History  Problem Relation Age of Onset   Cancer Mother    Breast cancer Mother 68   Diabetes Father    Hypertension Father     Social History:  reports that she has quit smoking. She has never used smokeless tobacco. She reports that she does not currently use alcohol. She reports that she does not use drugs.  Allergies: No Known Allergies  Medications reviewed.    ROS Full ROS performed and is otherwise negative other than what is stated in HPI   BP 131/79   Pulse 80   Temp 98.8 F (37.1 C)   Ht 5\' 1"  (1.549 m)   Wt 170 lb (77.1 kg)   SpO2 96%   BMI 32.12 kg/m   Physical Exam Vitals and nursing note reviewed. Exam conducted with a chaperone present.  Constitutional:      General: She is not in acute distress.    Appearance: Normal appearance. She is not ill-appearing.  Pulmonary:     Effort: Pulmonary effort is normal.     Breath sounds: No stridor.  Abdominal:     General: Abdomen is flat. There is no distension.     Palpations: Abdomen is soft. There is no mass.     Tenderness: There is no abdominal tenderness. There is no guarding.     Hernia:  No hernia is present.     Comments: Incisions completely healed. No infection or seromas.  Skin:    Capillary Refill: Capillary refill takes less than 2 seconds.  Neurological:     General: No focal deficit present.     Mental Status: She is alert.  Psychiatric:        Mood and Affect: Mood normal.        Thought Content: Thought content normal.        Judgment: Judgment normal.        Assessment/Plan: Monica Mckenzie is well-known to me with status post Hartman's.  Doing very well without complications.  She did have episode of upper abdominal pain that have subsided and improved with PPI.  Currently she denies any symptoms consistent with biliary colic.   She did have an ultrasound with some sludge.  At this time I do not recommend cholecystectomy as the symptoms are not clear-cut.  He however upper abdominal pain recurs we may entertain at some point in time performing cholecystectomy.  I specifically counseled her about alarming signs and symptoms of cholecystitis   Greater than 50% of the 20 minutes  visit was spent in counseling/coordination of care   Sterling Big, MD [facility] General Surgeon

## 2021-02-06 DIAGNOSIS — H2513 Age-related nuclear cataract, bilateral: Secondary | ICD-10-CM | POA: Diagnosis not present

## 2021-03-24 DIAGNOSIS — R1032 Left lower quadrant pain: Secondary | ICD-10-CM | POA: Diagnosis not present

## 2021-08-25 DIAGNOSIS — R1032 Left lower quadrant pain: Secondary | ICD-10-CM | POA: Diagnosis not present

## 2021-08-25 DIAGNOSIS — R1084 Generalized abdominal pain: Secondary | ICD-10-CM | POA: Diagnosis not present

## 2021-10-18 DIAGNOSIS — E063 Autoimmune thyroiditis: Secondary | ICD-10-CM | POA: Diagnosis not present

## 2021-10-18 DIAGNOSIS — E039 Hypothyroidism, unspecified: Secondary | ICD-10-CM | POA: Diagnosis not present

## 2021-10-27 DIAGNOSIS — R7309 Other abnormal glucose: Secondary | ICD-10-CM | POA: Diagnosis not present

## 2021-10-27 DIAGNOSIS — E039 Hypothyroidism, unspecified: Secondary | ICD-10-CM | POA: Diagnosis not present

## 2021-10-27 DIAGNOSIS — Z0000 Encounter for general adult medical examination without abnormal findings: Secondary | ICD-10-CM | POA: Diagnosis not present

## 2021-10-27 DIAGNOSIS — Z79899 Other long term (current) drug therapy: Secondary | ICD-10-CM | POA: Diagnosis not present

## 2021-10-27 DIAGNOSIS — Z1331 Encounter for screening for depression: Secondary | ICD-10-CM | POA: Diagnosis not present

## 2021-12-27 DIAGNOSIS — E039 Hypothyroidism, unspecified: Secondary | ICD-10-CM | POA: Diagnosis not present

## 2021-12-27 DIAGNOSIS — E063 Autoimmune thyroiditis: Secondary | ICD-10-CM | POA: Diagnosis not present

## 2022-02-27 DIAGNOSIS — E039 Hypothyroidism, unspecified: Secondary | ICD-10-CM | POA: Diagnosis not present

## 2022-02-27 DIAGNOSIS — E063 Autoimmune thyroiditis: Secondary | ICD-10-CM | POA: Diagnosis not present

## 2022-05-20 IMAGING — MG MM DIGITAL SCREENING BILAT W/ TOMO AND CAD
8 series · 8 of 24 positions shown · non-contrast
Comparison: Previous exam(s).

CLINICAL DATA: Screening.

EXAM:
DIGITAL SCREENING BILATERAL MAMMOGRAM WITH TOMOSYNTHESIS AND CAD
TECHNIQUE: Bilateral screening digital craniocaudal and mediolateral oblique
mammograms were obtained. Bilateral screening digital breast
tomosynthesis was performed. The images were evaluated with
computer-aided detection.

[R CC synth-2D]
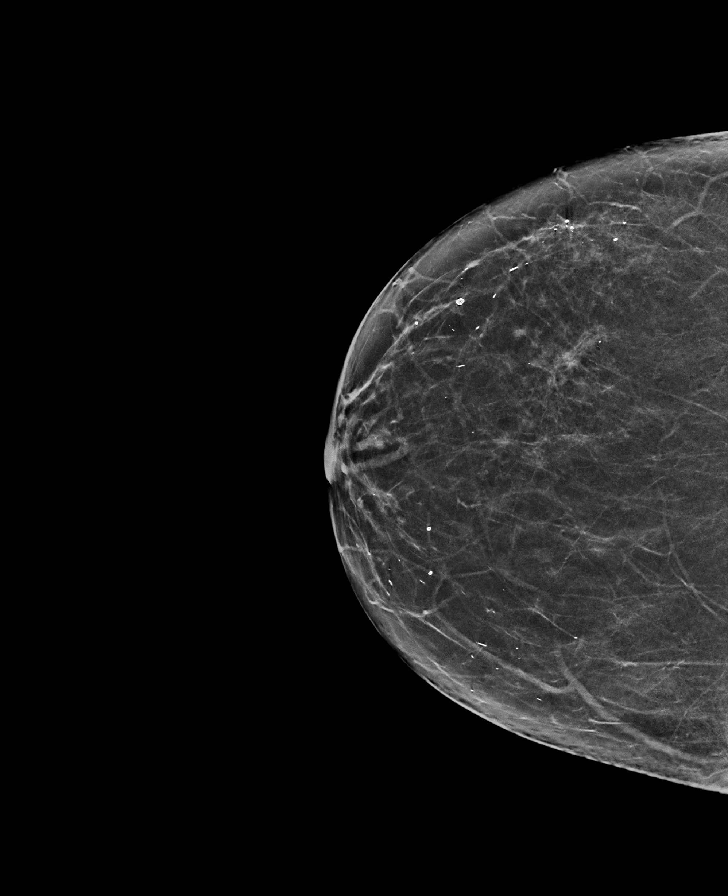

[L CC synth-2D]
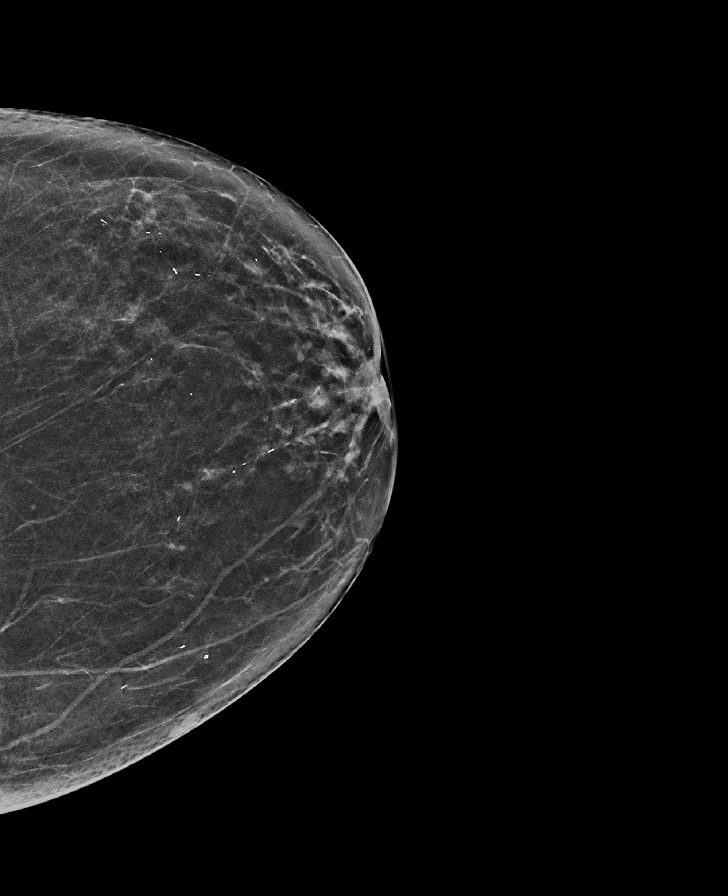

[L MLO synth-2D]
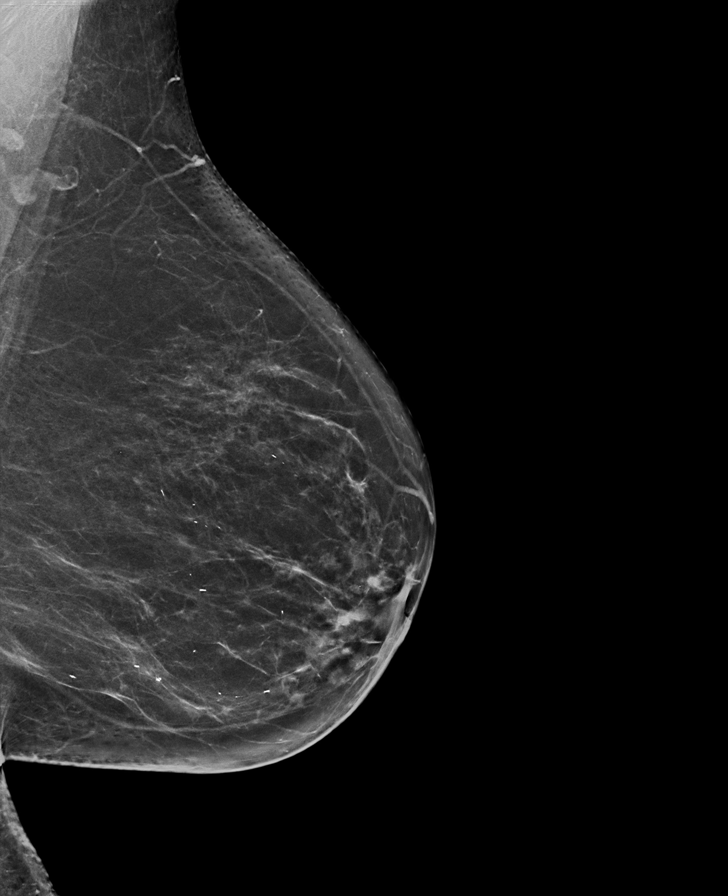

[R MLO synth-2D]
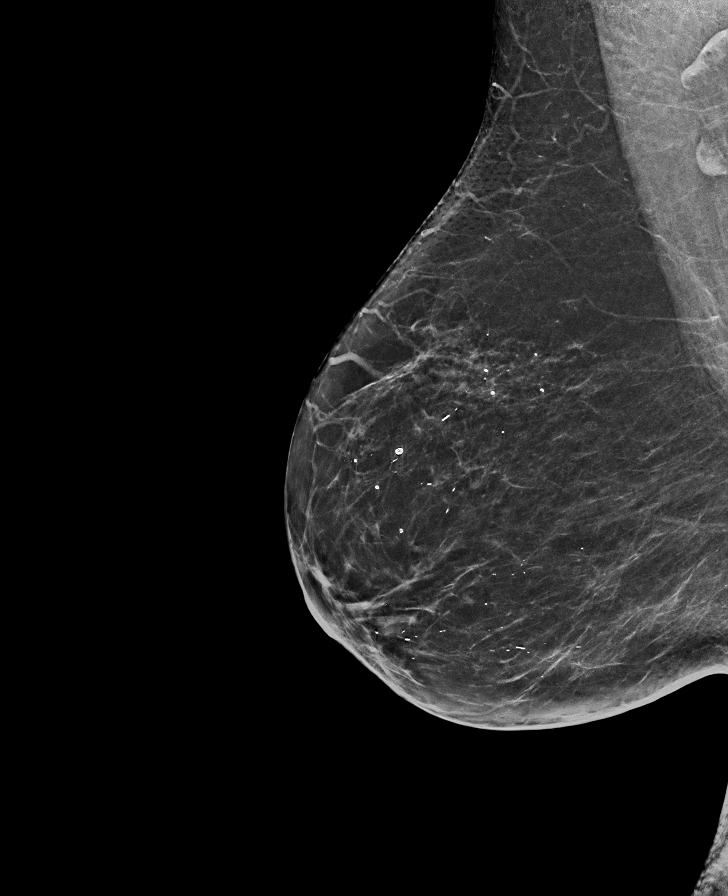

[L MLO tomo · tomo slice 37/72.0]
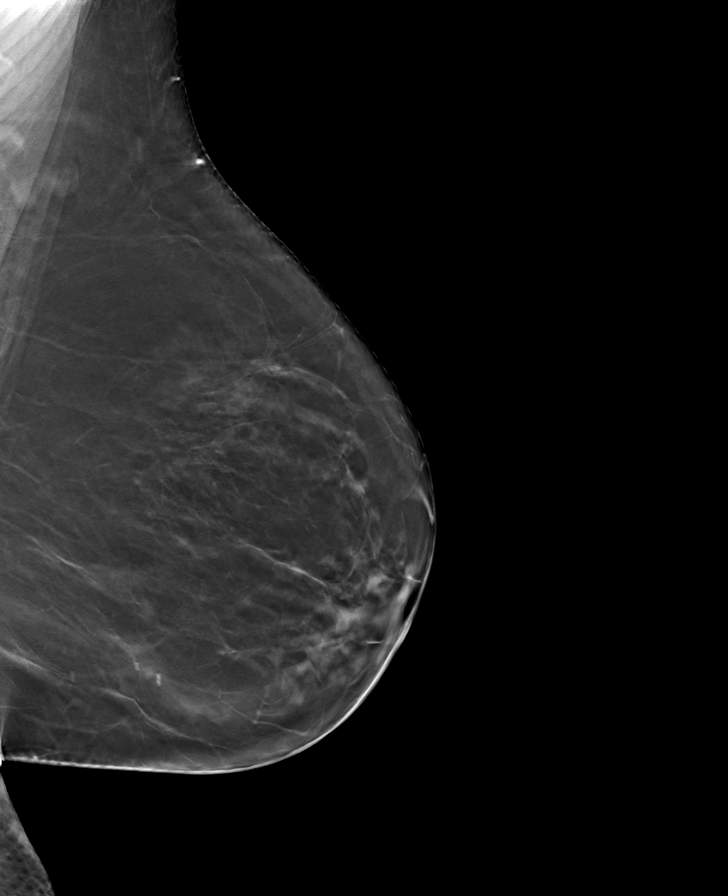

[L CC tomo · tomo slice 31/62.0]
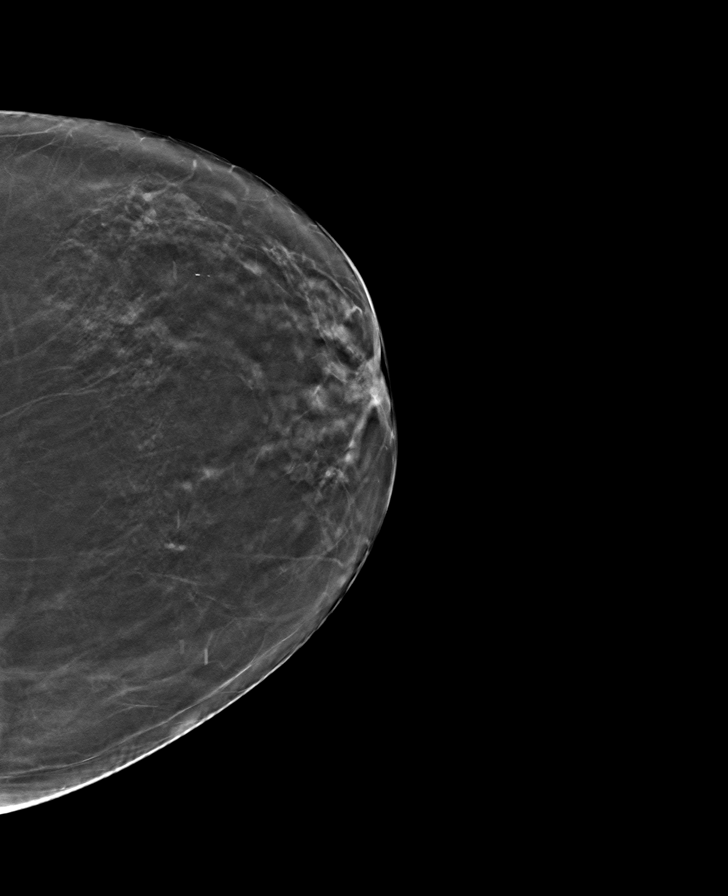

[R MLO tomo · tomo slice 35/69.0]
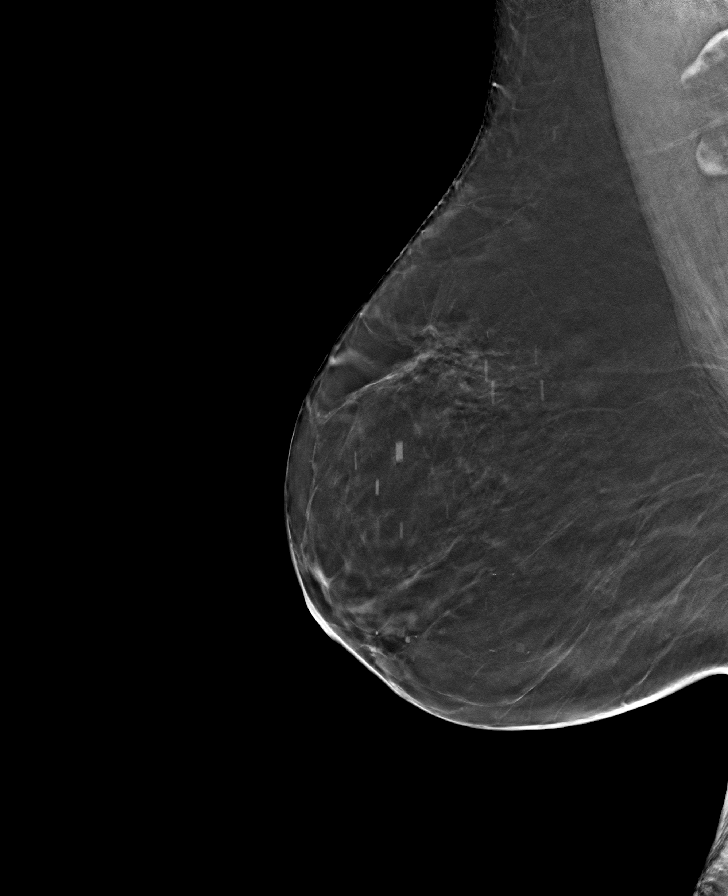

[R CC tomo · tomo slice 31/61.0]
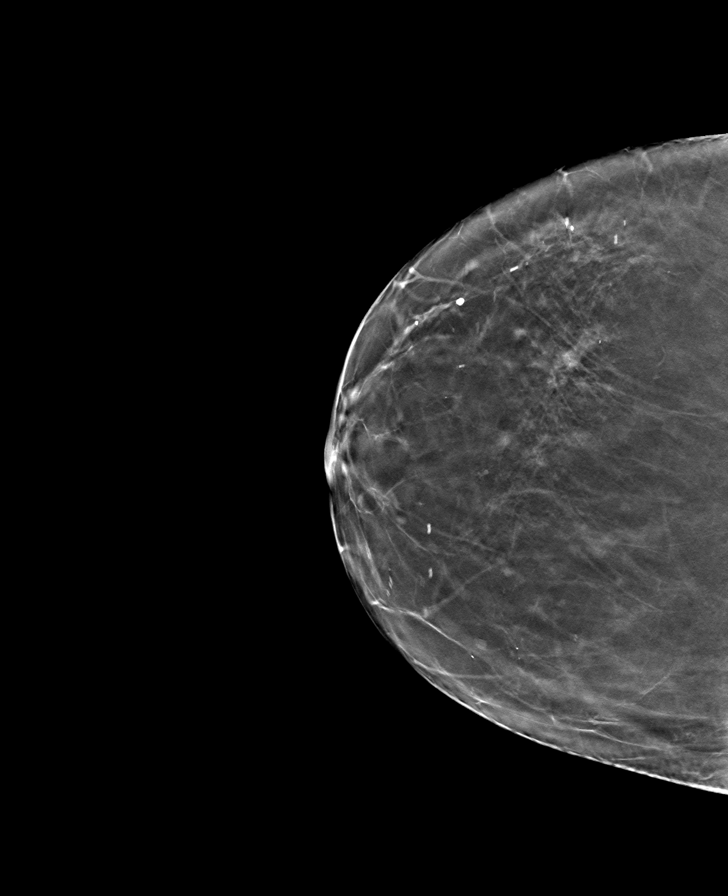

[8 of 24 positions shown; findings below may reference images not displayed]

ACR Breast Density Category b: There are scattered areas of
fibroglandular density.
FINDINGS: There are no findings suspicious for malignancy.
IMPRESSION: No mammographic evidence of malignancy. A result letter of this
screening mammogram will be mailed directly to the patient.

RECOMMENDATION:
Screening mammogram in one year. (Code:51-O-LD2)

BI-RADS CATEGORY  1: Negative.

## 2022-05-30 DIAGNOSIS — Z8639 Personal history of other endocrine, nutritional and metabolic disease: Secondary | ICD-10-CM | POA: Diagnosis not present

## 2022-06-05 DIAGNOSIS — M25551 Pain in right hip: Secondary | ICD-10-CM | POA: Diagnosis not present

## 2022-07-05 DIAGNOSIS — Z7184 Encounter for health counseling related to travel: Secondary | ICD-10-CM | POA: Diagnosis not present

## 2022-07-05 DIAGNOSIS — Z8719 Personal history of other diseases of the digestive system: Secondary | ICD-10-CM | POA: Diagnosis not present

## 2022-07-17 DIAGNOSIS — R7303 Prediabetes: Secondary | ICD-10-CM | POA: Diagnosis not present

## 2022-08-20 DIAGNOSIS — J40 Bronchitis, not specified as acute or chronic: Secondary | ICD-10-CM | POA: Diagnosis not present

## 2022-09-13 DIAGNOSIS — E038 Other specified hypothyroidism: Secondary | ICD-10-CM | POA: Diagnosis not present

## 2022-09-13 DIAGNOSIS — R7303 Prediabetes: Secondary | ICD-10-CM | POA: Diagnosis not present

## 2022-09-13 DIAGNOSIS — E063 Autoimmune thyroiditis: Secondary | ICD-10-CM | POA: Diagnosis not present

## 2022-10-22 DIAGNOSIS — R7309 Other abnormal glucose: Secondary | ICD-10-CM | POA: Diagnosis not present

## 2022-10-22 DIAGNOSIS — E039 Hypothyroidism, unspecified: Secondary | ICD-10-CM | POA: Diagnosis not present

## 2022-10-22 DIAGNOSIS — Z79899 Other long term (current) drug therapy: Secondary | ICD-10-CM | POA: Diagnosis not present

## 2022-10-29 DIAGNOSIS — Z79899 Other long term (current) drug therapy: Secondary | ICD-10-CM | POA: Diagnosis not present

## 2022-10-29 DIAGNOSIS — Z1331 Encounter for screening for depression: Secondary | ICD-10-CM | POA: Diagnosis not present

## 2022-10-29 DIAGNOSIS — R7303 Prediabetes: Secondary | ICD-10-CM | POA: Diagnosis not present

## 2022-10-29 DIAGNOSIS — E039 Hypothyroidism, unspecified: Secondary | ICD-10-CM | POA: Diagnosis not present

## 2022-10-29 DIAGNOSIS — Z0000 Encounter for general adult medical examination without abnormal findings: Secondary | ICD-10-CM | POA: Diagnosis not present

## 2022-11-05 DIAGNOSIS — M8588 Other specified disorders of bone density and structure, other site: Secondary | ICD-10-CM | POA: Diagnosis not present

## 2022-12-18 DIAGNOSIS — R7303 Prediabetes: Secondary | ICD-10-CM | POA: Diagnosis not present

## 2022-12-18 DIAGNOSIS — E038 Other specified hypothyroidism: Secondary | ICD-10-CM | POA: Diagnosis not present

## 2022-12-18 DIAGNOSIS — E063 Autoimmune thyroiditis: Secondary | ICD-10-CM | POA: Diagnosis not present

## 2023-03-04 DIAGNOSIS — E063 Autoimmune thyroiditis: Secondary | ICD-10-CM | POA: Diagnosis not present

## 2023-03-04 DIAGNOSIS — R7303 Prediabetes: Secondary | ICD-10-CM | POA: Diagnosis not present

## 2023-03-04 DIAGNOSIS — E038 Other specified hypothyroidism: Secondary | ICD-10-CM | POA: Diagnosis not present

## 2023-04-02 DIAGNOSIS — H2513 Age-related nuclear cataract, bilateral: Secondary | ICD-10-CM | POA: Diagnosis not present

## 2023-04-19 DIAGNOSIS — R0781 Pleurodynia: Secondary | ICD-10-CM | POA: Diagnosis not present

## 2023-07-11 DIAGNOSIS — E161 Other hypoglycemia: Secondary | ICD-10-CM | POA: Diagnosis not present

## 2023-07-11 DIAGNOSIS — E063 Autoimmune thyroiditis: Secondary | ICD-10-CM | POA: Diagnosis not present

## 2023-07-11 DIAGNOSIS — R7303 Prediabetes: Secondary | ICD-10-CM | POA: Diagnosis not present

## 2023-11-04 DIAGNOSIS — Z0000 Encounter for general adult medical examination without abnormal findings: Secondary | ICD-10-CM | POA: Diagnosis not present

## 2023-11-04 DIAGNOSIS — Z79899 Other long term (current) drug therapy: Secondary | ICD-10-CM | POA: Diagnosis not present

## 2023-11-04 DIAGNOSIS — Z1331 Encounter for screening for depression: Secondary | ICD-10-CM | POA: Diagnosis not present

## 2023-11-04 DIAGNOSIS — R7303 Prediabetes: Secondary | ICD-10-CM | POA: Diagnosis not present

## 2023-12-31 DIAGNOSIS — E161 Other hypoglycemia: Secondary | ICD-10-CM | POA: Diagnosis not present

## 2023-12-31 DIAGNOSIS — R7303 Prediabetes: Secondary | ICD-10-CM | POA: Diagnosis not present

## 2023-12-31 DIAGNOSIS — E063 Autoimmune thyroiditis: Secondary | ICD-10-CM | POA: Diagnosis not present

## 2024-04-22 ENCOUNTER — Encounter: Payer: Self-pay | Admitting: *Deleted

## 2024-04-22 NOTE — Progress Notes (Signed)
 Monica Mckenzie                                          MRN: [id number]   04/22/2024   The [facility] Quality Team Specialist reviewed this patient medical record for the purposes of chart review for care gap closure. The following were reviewed: chart review for care gap closure-breast cancer screening.    [facility] Quality Team
